# Patient Record
Sex: Female | Born: 1955 | Race: White | Hispanic: No | Marital: Married | Smoking: Never smoker
Health system: Southern US, Community
[De-identification: ages and names within clinical notes are randomized; demographics above are authoritative.]

## PROBLEM LIST (undated history)

## (undated) DIAGNOSIS — N6019 Diffuse cystic mastopathy of unspecified breast: Secondary | ICD-10-CM

## (undated) DIAGNOSIS — N3010 Interstitial cystitis (chronic) without hematuria: Secondary | ICD-10-CM

## (undated) DIAGNOSIS — N390 Urinary tract infection, site not specified: Secondary | ICD-10-CM

## (undated) DIAGNOSIS — R12 Heartburn: Secondary | ICD-10-CM

## (undated) DIAGNOSIS — I890 Lymphedema, not elsewhere classified: Secondary | ICD-10-CM

## (undated) DIAGNOSIS — D649 Anemia, unspecified: Secondary | ICD-10-CM

## (undated) DIAGNOSIS — K5790 Diverticulosis of intestine, part unspecified, without perforation or abscess without bleeding: Secondary | ICD-10-CM

## (undated) DIAGNOSIS — I10 Essential (primary) hypertension: Secondary | ICD-10-CM

## (undated) DIAGNOSIS — E559 Vitamin D deficiency, unspecified: Secondary | ICD-10-CM

## (undated) DIAGNOSIS — E785 Hyperlipidemia, unspecified: Secondary | ICD-10-CM

## (undated) DIAGNOSIS — K5792 Diverticulitis of intestine, part unspecified, without perforation or abscess without bleeding: Secondary | ICD-10-CM

## (undated) DIAGNOSIS — M1990 Unspecified osteoarthritis, unspecified site: Secondary | ICD-10-CM

## (undated) DIAGNOSIS — R011 Cardiac murmur, unspecified: Secondary | ICD-10-CM

## (undated) DIAGNOSIS — E7800 Pure hypercholesterolemia, unspecified: Secondary | ICD-10-CM

## (undated) DIAGNOSIS — D219 Benign neoplasm of connective and other soft tissue, unspecified: Secondary | ICD-10-CM

## (undated) DIAGNOSIS — K219 Gastro-esophageal reflux disease without esophagitis: Secondary | ICD-10-CM

## (undated) HISTORY — DX: Benign neoplasm of connective and other soft tissue, unspecified: D21.9

## (undated) HISTORY — DX: Heartburn: R12

## (undated) HISTORY — DX: Cardiac murmur, unspecified: R01.1

## (undated) HISTORY — DX: Interstitial cystitis (chronic) without hematuria: N30.10

## (undated) HISTORY — DX: Diverticulitis of intestine, part unspecified, without perforation or abscess without bleeding: K57.92

## (undated) HISTORY — DX: Urinary tract infection, site not specified: N39.0

## (undated) HISTORY — PX: CHOLECYSTECTOMY: SHX55

---

## 1985-09-03 HISTORY — PX: OTHER SURGICAL HISTORY: SHX169

## 1985-09-03 HISTORY — PX: CHOLECYSTECTOMY: SHX55

## 1985-09-03 HISTORY — PX: BREAST EXCISIONAL BIOPSY: SUR124

## 2003-09-04 HISTORY — PX: DILATION AND CURETTAGE OF UTERUS: SHX78

## 2004-08-03 ENCOUNTER — Ambulatory Visit: Payer: Self-pay | Admitting: Internal Medicine

## 2005-10-30 ENCOUNTER — Ambulatory Visit: Payer: Self-pay | Admitting: Unknown Physician Specialty

## 2007-11-12 ENCOUNTER — Ambulatory Visit: Payer: Self-pay | Admitting: Unknown Physician Specialty

## 2008-01-30 ENCOUNTER — Ambulatory Visit: Payer: Self-pay | Admitting: Unknown Physician Specialty

## 2008-02-05 ENCOUNTER — Ambulatory Visit: Payer: Self-pay | Admitting: Unknown Physician Specialty

## 2009-12-14 ENCOUNTER — Ambulatory Visit: Payer: Self-pay | Admitting: Unknown Physician Specialty

## 2011-05-24 ENCOUNTER — Ambulatory Visit: Payer: Self-pay | Admitting: Unknown Physician Specialty

## 2012-10-08 ENCOUNTER — Ambulatory Visit: Payer: Self-pay | Admitting: Internal Medicine

## 2013-08-17 ENCOUNTER — Ambulatory Visit: Payer: Self-pay | Admitting: Gastroenterology

## 2013-08-17 HISTORY — PX: COLONOSCOPY: SHX174

## 2013-08-18 ENCOUNTER — Ambulatory Visit: Payer: Self-pay | Admitting: Gastroenterology

## 2013-08-26 ENCOUNTER — Ambulatory Visit: Payer: Self-pay | Admitting: Gastroenterology

## 2013-12-15 ENCOUNTER — Ambulatory Visit: Payer: Self-pay | Admitting: Internal Medicine

## 2013-12-18 ENCOUNTER — Ambulatory Visit: Payer: Self-pay | Admitting: Internal Medicine

## 2015-05-11 ENCOUNTER — Other Ambulatory Visit: Payer: Self-pay | Admitting: Internal Medicine

## 2015-05-11 DIAGNOSIS — R1084 Generalized abdominal pain: Secondary | ICD-10-CM

## 2015-05-19 ENCOUNTER — Ambulatory Visit
Admission: RE | Admit: 2015-05-19 | Discharge: 2015-05-19 | Disposition: A | Discharge status: Home / Self Care | Payer: 59 | Source: Ambulatory Visit | Attending: Internal Medicine | Admitting: Internal Medicine

## 2015-05-19 DIAGNOSIS — K760 Fatty (change of) liver, not elsewhere classified: Secondary | ICD-10-CM | POA: Diagnosis not present

## 2015-05-19 DIAGNOSIS — R1084 Generalized abdominal pain: Secondary | ICD-10-CM | POA: Insufficient documentation

## 2015-05-19 MED ORDER — GADOBENATE DIMEGLUMINE 529 MG/ML IV SOLN
20.0000 mL | Freq: Once | INTRAVENOUS | Status: AC | PRN
  Administered 2015-05-19: 20 mL via INTRAVENOUS

## 2015-08-13 ENCOUNTER — Ambulatory Visit
Admission: EM | Admit: 2015-08-13 | Discharge: 2015-08-13 | Disposition: A | Discharge status: Home / Self Care | Payer: 59 | Attending: Family Medicine | Admitting: Family Medicine

## 2015-08-13 ENCOUNTER — Encounter: Payer: Self-pay | Admitting: Gynecology

## 2015-08-13 DIAGNOSIS — E54 Ascorbic acid deficiency: Secondary | ICD-10-CM | POA: Diagnosis not present

## 2015-08-13 DIAGNOSIS — T452X1A Poisoning by vitamins, accidental (unintentional), initial encounter: Secondary | ICD-10-CM

## 2015-08-13 HISTORY — DX: Unspecified osteoarthritis, unspecified site: M19.90

## 2015-08-13 NOTE — Discharge Instructions (Signed)

## 2015-08-13 NOTE — ED Notes (Signed)
Patient c/o diarrhea on 07/22/2015. Per patient took high dose of vitamin 2mg  x TID.Alexandra Rios Pt. Stated seen on the web that vitamin C help with diarrhea. After taking high dosage of vitamins C pt. Stated that she start having GI issue and again from the web will cause GI issue. Pt stated on the web to take Asorbic Acid. Per patient this am had nose bleed and leg weakness. Pt believe that she might have Scurvy.

## 2015-08-13 NOTE — ED Provider Notes (Signed)
CSN: [id number]     Arrival date & time 08/13/15  1353 History   First MD Initiated Contact with Patient 08/13/15 1641   Nurses notes were reviewed. Chief Complaint  Patient presents with  . GI Problem   Patient is here for concern over side effects and have occurred from taking too much of a non-FDA regulated product over-the-counter. Patient is on no special diet which she is introducing small amounts of certain foods to her diet. 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 To improve the arthritis which she she was told would clear once the bile was clear and she decided to take increased doses of vitamin C and other agents to try to boost her immune system. The way she took that was by taking lysine high doses and vitamin C. The vitamin C she started over-the-counter vitamin C product that basically had the pro-vitamin Guerry Bruin or gastric acid. She states that she was comfortable using it since it said that it was safe however explained to her that there is no activity regulation the supplements and I do not recognize his Erlene Quan is a brand this certainly is going to do everything FDA level of manufacturing. Over the last few weeks she isincreased diarrhea GI problems and she finally came to the conclusion that the supplement was not doing well for her by her. She thought she weaned herself off the supplements satisfactory slowly but last dose was Thursday and she started having the last 2-3 days weakness of her legs as well as sheath was concerned about scurvy diarrhea and today while at a rehearsal she started having nosebleed. She has checked online about reduction of vitamin C doses and has seen where people can have a withdrawal state from vitamin C which is similar to a vitamin C mega-deficiency. Because of that she has come in to be seen and evaluated. (Consider location/radiation/quality/duration/timing/severity/associated sxs/prior Treatment) Patient is a 59 y.o. female  presenting with GI illness. The history is provided by the patient.  GI Problem This is a new problem. The current episode started more than 2 days ago. The problem has not changed since onset.Pertinent negatives include no chest pain, no abdominal pain, no headaches and no shortness of breath. Nothing aggravates the symptoms. Nothing relieves the symptoms. She has tried nothing for the symptoms. The treatment provided no relief.    Past Medical History  Diagnosis Date  . Arthritis     viral   Past Surgical History  Procedure Laterality Date  . Cholecystectomy     History reviewed. No pertinent family history. Social History  Substance Use Topics  . Smoking status: Never Smoker   . Smokeless tobacco: None  . Alcohol Use: No   OB History    No data available     Review of Systems  Constitutional: Positive for activity change.  HENT: Positive for nosebleeds.   Respiratory: Negative for shortness of breath and stridor.   Cardiovascular: Negative for chest pain.  Gastrointestinal: Negative for abdominal pain.  Musculoskeletal: Positive for myalgias, joint swelling and arthralgias.  Skin: Negative for color change, pallor and rash.  Neurological: Negative for headaches.  Psychiatric/Behavioral: Negative for hallucinations.  All other systems reviewed and are negative.   Allergies  Cephalosporins; Dairy aid; and Eggs or egg-derived products  Home Medications   Prior to Admission medications   Medication Sig Start Date End Date Taking? Authorizing Provider  Ascorbic Acid POWD by Does not apply route.   Yes  Historical Provider, MD  DIGEST ENZYMES-ANTICHOLINERGIC PO Take by mouth.   Yes Historical Provider, MD  Fish Oil-Krill Oil (KRILL OIL PLUS) CAPS Take by mouth.   Yes Historical Provider, MD  Lactobacillus ([place] PRO-B PO) Take by mouth.   Yes Historical Provider, MD   Meds Ordered and Administered this Visit  Medications - No data to display  BP 124/70 mmHg  Pulse  62  Temp(Src) 97.7 F (36.5 C)  Resp 16  Ht 5\' 5"  (1.651 m)  Wt 215 lb (97.523 kg)  BMI 35.78 kg/m2  SpO2 100% No data found.   Physical Exam  Constitutional: She is oriented to person, place, and time. She appears well-developed and well-nourished.  HENT:  Head: Normocephalic and atraumatic.  Eyes: Conjunctivae are normal. Pupils are equal, round, and reactive to light.  Musculoskeletal: Normal range of motion.  Neurological: She is alert and oriented to person, place, and time.  Skin: Skin is warm.  Psychiatric: She has a normal mood and affect.  Vitals reviewed.   ED Course  Procedures (including critical care time)  Labs Review Labs Reviewed - No data to display  Imaging Review No results found.   Visual Acuity Review  Right Eye Distance:   Left Eye Distance:   Bilateral Distance:    Right Eye Near:   Left Eye Near:    Bilateral Near:         MDM   1. Vitamin C overdose, accidental or unintentional, initial encounter   2. Vitamin C deficiency     We have spent this visit mostly in counseling. Explained to her that is difficult taking a non-FDA regulated product and extrapolating how much vitamin C she did take with this product, she is not getting. I have suggested that she the future use vitamins with high standards and not just over the shelf and that the time being since she cannot eat citrus fruits that she consider consuming sutures juices to build her fibrin sea level up to a high level and then slowly taper it under more control conditions. I recommend 12 ounces of vitamin C in the form of orange juice 4 times a day for about 4 days and then reducing it by about 4 ounces over the next several days. Hopefully this will give her the level of vitamin C that she needs and then slowly taper off of it. I suggested the future to be careful about the supplementation she's taking.   As far as the diarrhea we talked about the need to check clostridium or overt  parasites and C&S but she really has not been on any antibiotics lately and does not feel like she has an infection so we will would offer any stool specimen for stool analysis.    Frederich Cha, MD 08/14/15 [phone]

## 2015-08-30 ENCOUNTER — Ambulatory Visit (INDEPENDENT_AMBULATORY_CARE_PROVIDER_SITE_OTHER): Payer: 59 | Admitting: Podiatry

## 2015-08-30 ENCOUNTER — Ambulatory Visit (INDEPENDENT_AMBULATORY_CARE_PROVIDER_SITE_OTHER): Payer: 59

## 2015-08-30 ENCOUNTER — Encounter: Payer: Self-pay | Admitting: Podiatry

## 2015-08-30 VITALS — BP 121/60 | HR 62 | Resp 16

## 2015-08-30 DIAGNOSIS — G43909 Migraine, unspecified, not intractable, without status migrainosus: Secondary | ICD-10-CM | POA: Insufficient documentation

## 2015-08-30 DIAGNOSIS — E669 Obesity, unspecified: Secondary | ICD-10-CM | POA: Insufficient documentation

## 2015-08-30 DIAGNOSIS — I10 Essential (primary) hypertension: Secondary | ICD-10-CM | POA: Insufficient documentation

## 2015-08-30 DIAGNOSIS — M2040 Other hammer toe(s) (acquired), unspecified foot: Secondary | ICD-10-CM

## 2015-08-30 DIAGNOSIS — D649 Anemia, unspecified: Secondary | ICD-10-CM | POA: Insufficient documentation

## 2015-08-30 DIAGNOSIS — N390 Urinary tract infection, site not specified: Secondary | ICD-10-CM | POA: Insufficient documentation

## 2015-08-30 NOTE — Progress Notes (Signed)
   Subjective:    Patient ID: Alexandra Rios, female    DOB: 04-May-1956, 59 y.o.   MRN: [id number]  HPI: She presents today with a chief complaint of pain to the second third toes bilateral left greater than right she also has pain to the second metatarsophalangeal joints bilateral. 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 She states that the arthritis in her hands have been resolving and so has arthritis in her feet to some degree. She states the deformity in her toes developed since October. She states that she has also developed tenderness beneath the second and third metatarsophalangeal joints. She has a history of a wart that she has been treating with Compound W plantar left foot.      Review of Systems  Musculoskeletal: Positive for arthralgias.  All other systems reviewed and are negative.      Objective:   Physical Exam: 59 year old female vital signs stable alert and oriented 3 pulses are strongly palpable. Neurologic sensorium is intact. Deep tendon reflexes are intact. Muscle strength was 5 over 5 dorsiflexion and plantar flexors and inverters everters all of his musculature is intact. Orthopedic evaluation demonstrates mild hammertoe deformities #2 #3 bilaterally. Left greater than right she has rigid deformity second PIPJ left foot that will not straighten out to complete horizontal. She has an area of erythema overlying the PIPJ significant for irritation with shoe gear. She also has tenderness on range of motion of the second metatarsophalangeal joint on dorsiflexion and plantar flexion with end range of motion. Radiographs confirm hammertoe deformities bilateral no major osseous abnormalities on radiograph. Cutaneous evaluation demonstrates no erythema edema cellulitis drainage or odor the exception of mild erythema overlying the PIPJ second digit left foot mild reactive hyperkeratosis to the plantar aspect of the second metatarsophalangeal joint  left foot and an area of reactive hyperkeratosis plantar aspect of the left foot which does appear to be an old wart. I debrided this lesion today and it appears to be perfectly normal beneath the skin.        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 Mild capsulitis of the second metatarsophalangeal joint left foot greater than that of the right. Well-healed wart plantar aspect left foot.  Plan: Discussed appropriate shoe gear stretching exercises ice therapy shoe gear modifications we discussed stiff soled shoes at this point. I offered her an injection which she declined all of her medication which she declined. I will follow-up with her as needed.

## 2015-11-02 HISTORY — PX: BREAST BIOPSY: SHX20

## 2015-11-17 ENCOUNTER — Encounter: Payer: Self-pay | Admitting: *Deleted

## 2015-11-22 ENCOUNTER — Ambulatory Visit: Payer: Self-pay | Admitting: Urology

## 2015-11-22 ENCOUNTER — Encounter: Payer: Self-pay | Admitting: Urology

## 2015-11-25 ENCOUNTER — Ambulatory Visit: Payer: Self-pay | Admitting: Urology

## 2015-11-28 ENCOUNTER — Encounter: Payer: Self-pay | Admitting: General Surgery

## 2015-11-28 ENCOUNTER — Ambulatory Visit (INDEPENDENT_AMBULATORY_CARE_PROVIDER_SITE_OTHER): Payer: 59 | Admitting: General Surgery

## 2015-11-28 ENCOUNTER — Other Ambulatory Visit: Payer: Self-pay

## 2015-11-28 VITALS — BP 128/74 | HR 80 | Resp 14 | Ht 65.0 in | Wt 244.0 lb

## 2015-11-28 DIAGNOSIS — N6320 Unspecified lump in the left breast, unspecified quadrant: Secondary | ICD-10-CM

## 2015-11-28 DIAGNOSIS — N63 Unspecified lump in breast: Secondary | ICD-10-CM | POA: Diagnosis not present

## 2015-11-28 NOTE — Progress Notes (Signed)
Patient ID: Alexandra Rios, female   DOB: [birth year], 58 y.o.   MRN: [id number]  Chief Complaint  Patient presents with  . other    left breast mass    HPI Alexandra Rios is a 60 y.o. female here today for a evaluation of a left breast mass. Last mammogram was 08/2015  Noticed lump 2 weeks ago. A little tender to touch. She states it is the same area that a biopsy was completed 25 years ago shortly after the birth of her only child with the finding of fibrocystic changes.  The patient reports that she was reading a book and appreciated some discomfort in the superior lateral aspect of the breast. She looked in the mirror and saw a domed fullness and at that time became aware of a palpable mass. No description of erythema or lymphangitic streaking.  No history of trauma to the area.  Patient does not perform self breast check but gets regular mammograms.  I personally reviewed the patient's history. HPI  Past Medical History  Diagnosis Date  . Arthritis     viral    Past Surgical History  Procedure Laterality Date  . Cholecystectomy    . Breast biopsy  Left 1987  . Dilation and curettage of uterus  2005    History reviewed. No pertinent family history.  Social History Social History  Substance Use Topics  . Smoking status: Never Smoker   . Smokeless tobacco: None  . Alcohol Use: No    Allergies  Allergen Reactions  . Cephalosporins Itching  . Dairy Aid [Lactase]   . Eggs Or Egg-Derived Products   . Sulfamethoxazole-Trimethoprim Itching    Current Outpatient Prescriptions  Medication Sig Dispense Refill  . Ascorbic Acid POWD by Does not apply route.    . D-Mannose POWD Take by mouth.    . DIGEST ENZYMES-ANTICHOLINERGIC PO Take by mouth.    . Fish Oil-Krill Oil (KRILL OIL PLUS) CAPS Take by mouth.    . Lactobacillus ([place] PRO-B PO) Take by mouth.     No current facility-administered medications for this visit.    Review of Systems Review of Systems   Constitutional: Negative.   Respiratory: Negative.   Cardiovascular: Negative.     Blood pressure 128/74, pulse 80, resp. rate 14, height 5\' 5"  (1.651 m), weight 244 lb (110.678 kg).  Physical Exam Physical Exam  Constitutional: She is oriented to person, place, and time. She appears well-developed and well-nourished.  Eyes: Conjunctivae are normal. No scleral icterus.  Neck: Neck supple.  Cardiovascular: Normal rate, regular rhythm and normal heart sounds.   Pulmonary/Chest: Effort normal and breath sounds normal. Right breast exhibits no inverted nipple, no mass, no nipple discharge, no skin change and no tenderness. Left breast exhibits tenderness (primarily in the upper-outer quadrant, less so in the other areas of the breast). Left breast exhibits no inverted nipple, no nipple discharge and no skin change.    Abdominal: Soft. Normal appearance and bowel sounds are normal. There is no tenderness.  Lymphadenopathy:    She has no cervical adenopathy.    She has no axillary adenopathy.  Neurological: She is alert and oriented to person, place, and time.  Skin: Skin is warm and dry.    Data Reviewed GYN notes of 11/17/2015 completed by Kelton Pillar, M.D. were reviewed. December 2016 mammograms from [facility] reviewed. (Report pending). Primarily fatty replaced breasts with no discernible mass in the upper-outer quadrant on the left.  Ultrasound examination of  the area of palpable thickening showed an ill-defined slightly hypoechoic mass measuring 1 x 1.48 x 1.49 cm in the 1:00 position when the patient was examined in a semierect position with her arm elevated over her head. The area is more clearly palpable and evident on ultrasound. [id number].  Assessment    New left breast mass, normal mammograms December 2016, indeterminant ultrasound today.    Plan    The area requires biopsy to tablets of pathologic diagnosis. The patient was very interested in all the possible  benign diagnoses, I explained to her I was really only interested if it was malignant and a biopsy would be strongly recommended. It is certainly not a cyst based on ultrasound.  The patient is amenable to return tomorrow for a biopsy area as was after 5:00 PM when her visit was being completed. Next this will not change when the biopsy results are available.    Patient to return tomorrow for left breast biopsy  PCP:  Fulton Reek D This information has been scribed by Gaspar Cola CMA.    Robert Bellow 11/28/2015, 9:37 PM

## 2015-11-28 NOTE — Patient Instructions (Signed)
Patient to return for a left breast biopsy.

## 2015-11-29 ENCOUNTER — Encounter: Payer: Self-pay | Admitting: General Surgery

## 2015-11-29 ENCOUNTER — Ambulatory Visit: Payer: Self-pay

## 2015-11-29 ENCOUNTER — Ambulatory Visit (INDEPENDENT_AMBULATORY_CARE_PROVIDER_SITE_OTHER): Payer: 59 | Admitting: Urology

## 2015-11-29 ENCOUNTER — Encounter: Payer: Self-pay | Admitting: Urology

## 2015-11-29 ENCOUNTER — Ambulatory Visit (INDEPENDENT_AMBULATORY_CARE_PROVIDER_SITE_OTHER): Payer: 59 | Admitting: General Surgery

## 2015-11-29 ENCOUNTER — Ambulatory Visit
Admission: RE | Admit: 2015-11-29 | Discharge: 2015-11-29 | Disposition: A | Discharge status: Home / Self Care | Payer: 59 | Source: Ambulatory Visit | Attending: Urology | Admitting: Urology

## 2015-11-29 VITALS — BP 141/74 | HR 72 | Ht 65.5 in | Wt 241.1 lb

## 2015-11-29 VITALS — BP 130/78 | HR 76 | Resp 12 | Ht 65.0 in | Wt 239.0 lb

## 2015-11-29 DIAGNOSIS — R3129 Other microscopic hematuria: Secondary | ICD-10-CM | POA: Diagnosis not present

## 2015-11-29 DIAGNOSIS — R109 Unspecified abdominal pain: Secondary | ICD-10-CM

## 2015-11-29 DIAGNOSIS — N63 Unspecified lump in breast: Secondary | ICD-10-CM | POA: Diagnosis not present

## 2015-11-29 DIAGNOSIS — IMO0002 Reserved for concepts with insufficient information to code with codable children: Secondary | ICD-10-CM

## 2015-11-29 DIAGNOSIS — N6320 Unspecified lump in the left breast, unspecified quadrant: Secondary | ICD-10-CM

## 2015-11-29 DIAGNOSIS — N390 Urinary tract infection, site not specified: Secondary | ICD-10-CM

## 2015-11-29 DIAGNOSIS — R748 Abnormal levels of other serum enzymes: Secondary | ICD-10-CM | POA: Diagnosis not present

## 2015-11-29 DIAGNOSIS — IMO0001 Reserved for inherently not codable concepts without codable children: Secondary | ICD-10-CM

## 2015-11-29 DIAGNOSIS — N8110 Cystocele, unspecified: Secondary | ICD-10-CM | POA: Diagnosis not present

## 2015-11-29 DIAGNOSIS — R7989 Other specified abnormal findings of blood chemistry: Secondary | ICD-10-CM

## 2015-11-29 LAB — URINALYSIS, COMPLETE
Bilirubin, UA: NEGATIVE
Glucose, UA: NEGATIVE
Ketones, UA: NEGATIVE
LEUKOCYTES UA: NEGATIVE
NITRITE UA: NEGATIVE
Specific Gravity, UA: 1.025 (ref 1.005–1.030)
Urobilinogen, Ur: 0.2 mg/dL (ref 0.2–1.0)
pH, UA: 7 (ref 5.0–7.5)

## 2015-11-29 LAB — MICROSCOPIC EXAMINATION

## 2015-11-29 LAB — BLADDER SCAN AMB NON-IMAGING: Scan Result: 0

## 2015-11-29 NOTE — Progress Notes (Signed)
11/29/2015 7:27 PM   Eugenio Hoes 03/30/1956 [id number]  Referring provider: Idelle Crouch, MD [facility] [place], [place] [address]  Chief Complaint  Patient presents with  . Recurrent UTI    referred by Dr. Vernie Ammons  . Hematuria    HPI: Patient is a 60 year old Caucasian female who is referred to [place] by Dr. Vernie Ammons for recurrent urinary tract infections and hematuria for further evaluation and management.  Over the past year, patient has had 4 urinary tract infections.  They have been positive for Escherichia coli and Klebsiella and have been pansensitive.  She complains of urine, chills and feeling of general malaise with these infections.  She has not experienced any gross hematuria, dysuria or suprapubic pain with the infections.  She has not had fevers or vomiting with the infections.  As been having intermittent right-sided abdominal pain.    She is using OTC urine strips to check for infections.  When she feels that she may have a UTI coming on, she uses these strips to see if she may have an infection.  If the strip was positive for leukocytes or blood, she starts and OTC supplement, D-Mannose.  This supplement will sometimes cause her symptoms to abate.  She has concern that these strips often positive for leukocytes or blood and would like further evaluation concerning these findings.  She is sexually active, but she has not noted a correlation with these infections and sexual activity. Her UA today is unremarkable and her PVR is 0 mL.    Of note, she had a breast biopsy this morning.     PMH: Past Medical History  Diagnosis Date  . Arthritis     viral  . Heart burn   . Recurrent UTI   . Heart murmur     Surgical History: Past Surgical History  Procedure Laterality Date  . Cholecystectomy    . Breast biopsy  Left 1987  . Dilation and curettage of uterus  2005    Home Medications:    Medication List       This list  is accurate as of: 11/29/15 11:59 PM.  Always use your most recent med list.               Ascorbic Acid Powd  by Does not apply route. Reported on 11/29/2015     CENTRUM SILVER PO  Take by mouth.     D-Mannose Powd  Take by mouth.     DIGEST ENZYMES-ANTICHOLINERGIC PO  Take by mouth.     KRILL OIL PLUS Caps  Take by mouth.     [place] PRO-B PO  Take by mouth.        Allergies:  Allergies  Allergen Reactions  . Cephalosporins Itching  . Dairy Aid [Lactase]   . Eggs Or Egg-Derived Products   . Sulfamethoxazole-Trimethoprim Itching    Family History: Family History  Problem Relation Age of Onset  . Kidney disease Neg Hx   . Bladder Cancer Neg Hx     Social History:  reports that she has never smoked. She does not have any smokeless tobacco history on file. She reports that she does not drink alcohol or use illicit drugs.  ROS: UROLOGY Frequent Urination?: No Hard to postpone urination?: No Burning/pain with urination?: No Get up at night to urinate?: Yes Leakage of urine?: Yes Urine stream starts and stops?: No Trouble starting stream?: No Do you have to strain to urinate?: No  Blood in urine?: Yes Urinary tract infection?: Yes Sexually transmitted disease?: No Injury to kidneys or bladder?: No Painful intercourse?: No Weak stream?: No Currently pregnant?: No Vaginal bleeding?: No Last menstrual period?: n  Gastrointestinal Nausea?: No Vomiting?: No Indigestion/heartburn?: No Diarrhea?: No Constipation?: No  Constitutional Fever: No Night sweats?: Yes Weight loss?: No Fatigue?: Yes  Skin Skin rash/lesions?: No Itching?: No  Eyes Blurred vision?: No Double vision?: No  Ears/Nose/Throat Sore throat?: No Sinus problems?: Yes  Hematologic/Lymphatic Swollen glands?: Yes Easy bruising?: No  Cardiovascular Leg swelling?: No Chest pain?: No  Respiratory Cough?: No Shortness of breath?: No  Endocrine Excessive thirst?:  No  Musculoskeletal Back pain?: Yes Joint pain?: Yes  Neurological Headaches?: No Dizziness?: No  Psychologic Depression?: No Anxiety?: No  Physical Exam: BP 141/74 mmHg  Pulse 72  Ht 5' 5.5" (1.664 m)  Wt 241 lb 1.6 oz (109.362 kg)  BMI 39.50 kg/m2  Constitutional: Well nourished. Alert and oriented, No acute distress. HEENT: [place] AT, moist mucus membranes. Trachea midline, no masses. Cardiovascular: No clubbing, cyanosis, or edema. Respiratory: Normal respiratory effort, no increased work of breathing. GI: Abdomen is soft, non tender, non distended, no abdominal masses. Liver and spleen not palpable.  No hernias appreciated.  Stool sample for occult testing is not indicated.   GU: No CVA tenderness.  No bladder fullness or masses.  Normal external genitalia, normal pubic hair distribution, no lesions.  Normal urethral meatus, no lesions, no prolapse, no discharge.   No urethral masses, tenderness and/or tenderness. No bladder fullness, tenderness or masses. Normal vagina mucosa, good estrogen effect, no discharge, no lesions, good pelvic support, no cystocele or rectocele noted.  No cervical motion tenderness.  Uterus is freely mobile and non-fixed.  No adnexal/parametria masses or tenderness noted.  Anus and perineum are without rashes or lesions.    Skin: No rashes, bruises or suspicious lesions. Lymph: No cervical or inguinal adenopathy. Neurologic: Grossly intact, no focal deficits, moving all 4 extremities. Psychiatric: Normal mood and affect.  Laboratory Data: Urinalysis Microscopic Examination  Result Value Ref Range   WBC, UA 0-5 0 -  5 /hpf   RBC, UA 0-2 0 -  2 /hpf   Epithelial Cells (non renal) 0-10 0 - 10 /hpf   Mucus, UA Present (A) Not Estab.   Bacteria, UA Few (A) None seen/Few  Urinalysis, Complete  Result Value Ref Range   Specific Gravity, UA 1.025 1.005 - 1.030   pH, UA 7.0 5.0 - 7.5   Color, UA Yellow Yellow   Appearance Ur Clear Clear   Leukocytes,  UA Negative Negative   Protein, UA 1+ (A) Negative/Trace   Glucose, UA Negative Negative   Ketones, UA Negative Negative   RBC, UA Trace (A) Negative   Bilirubin, UA Negative Negative   Urobilinogen, Ur 0.2 0.2 - 1.0 mg/dL   Nitrite, UA Negative Negative   Microscopic Examination See below:     + UCx for E. Coli 01/07/2015 + UCx for Kleb 06/08/2015 + UCx for E. Coli 09/27/2015 + UCx for E. Coli for 10/21/2015  Pertinent Imaging: Results for AKEISHA, FRANCES (MRN [id number]) as of 11/29/2015 16:06  Ref. Range 11/29/2015 15:52  Scan Result Unknown 0    Assessment & Plan:    1. Recurrent UTI:   Patient has had 4 documented urinary tract infections over the last year.  Her UA is unremarkable at this time.  reviewed perineal hygiene with the patient.  Her UA was unremarkable at this  time.  Her PVR is 0 mL so retention of urine is not a cause of her UTI's.  She was not found to have vaginal atrophy at this time.  We will send the urine for culture.  We will continue to monitor.  I have advised the patient not to use the OTC urine strips, as they can often be inaccurate.  We will obtain a KUB at this time to look for stones.    - Urinalysis, Complete - CULTURE, URINE COMPREHENSIVE - BUN+Creat  2. Positive urine dips for blood:   I explained to the patient's that AUA guidelines require 3 or more red blood cells per high-power field on a microscopic examination of urine or gross hematuria to pursue a hematuria workup.  At this time, she does not meet the criteria as she has not had gross hematuria or documented microscopic hematuria.  We will continue to monitor microscopic UA's.  She will report any episodes of gross hematuria.  3. Right sided abdominal pain:   We will obtain a KUB today to look for a possible stone that may be causing her right-sided abdominal pain and recurrent urinary tract infections.  Return for pending urine culture and KUB resutls.  These notes generated with voice  recognition software. I apologize for typographical errors.  Zara Council, PA-C  [facility] [address], [place], [place] [address] [phone]  Addendum:  Patient was found to have a serum creatinine of 1.26 mg/dL with a GFR of 47 on 11/29/2015. This is a significant decrease of kidney function from a serum creatinine of 0.7 and a GFR of 86 in 05/11/2015.  She was also found to have an incident of microscopic hematuria with her at her PCP's office with 4-10RBC's/hpf on 05/11/2015.  The decision was made to obtain a CT Urogram.  Results are below:  CLINICAL DATA: Intermittent urinary tract infections since September 2016. Recent hematuria.  EXAM: CT ABDOMEN AND PELVIS WITHOUT AND WITH CONTRAST  TECHNIQUE: Multidetector CT imaging of the abdomen and pelvis was performed following the standard protocol before and following the bolus administration of intravenous contrast.  CONTRAST: 135mL OMNIPAQUE IOHEXOL 350 MG/ML SOLN  COMPARISON: None.  FINDINGS: Lower chest: The lung bases are clear of acute process. No pleural effusion or pulmonary lesions. The heart is normal in size. No pericardial effusion. The distal esophagus and aorta are unremarkable.  Hepatobiliary: No focal hepatic lesions or intrahepatic biliary dilatation. The gallbladder is surgically absent. No common bile duct dilatation.  Pancreas: No mass, inflammation or ductal dilatation.  Spleen: Normal size. No focal lesions.  Adrenals/Urinary Tract: The adrenal glands are normal.  No renal, ureteral or bladder calculi. Both kidneys demonstrate normal enhancement/perfusion following contrast administration. No renal lesions. The delayed images do not demonstrate any collecting system abnormalities. Both ureters are normal. The bladder is normal.  Stomach/Bowel: The stomach, duodenum, small bowel and colon are grossly normal without oral contrast. No  obvious inflammatory changes, mass lesions or obstructive findings. Moderate stool noted throughout the colon. Moderate colonic diverticulosis without findings for acute diverticulitis. The terminal ileum is normal. The appendix is normal.  Vascular/Lymphatic: The aorta and branch vessels are patent. No significant atherosclerotic changes. The major venous structures are patent. Hazy interstitial change in the root of the small bowel mesenteric a along with small scattered lymph nodes consistent with mesenteritis or panniculitis. This is a benign incidental finding.  No mesenteric or retroperitoneal mass or adenopathy. Small scattered lymph nodes are noted.  Reproductive: PE uterus and ovaries are unremarkable.  Other: No pelvic mass or adenopathy. No free pelvic fluid collections. No inguinal mass or adenopathy. No abdominal wall hernia or subcutaneous lesions.  Musculoskeletal: No significant bony findings. Moderate thoracolumbar scoliosis.  IMPRESSION: 1. No CT findings to account for the patient's hematuria. No renal, ureteral or bladder calculi or mass. 2. No acute abdominal/pelvic findings, mass lesions or lymphadenopathy. 3. Status post cholecystectomy. No biliary dilatation.   Electronically Signed  By: Marijo Sanes M.D.  On: 12/02/2015 11:33   Patient was contacted with these results.  She will be undergoing a cystoscopy in the future to complete her hematuria work-up.  She will also be referred to nephrology for further evaluation of the decline in her kidney function.

## 2015-11-29 NOTE — Progress Notes (Signed)
Patient ID: Alexandra Rios, female   DOB: 07/04/1956, 60 y.o.   MRN: [id number]  Chief Complaint  Patient presents with  . Procedure    left breast biopsy    HPI [name] is a 60 y.o. female here today for a left breast biopsy.No change in her clinical history since yesterday's exam. HPI  Past Medical History  Diagnosis Date  . Arthritis     viral  . Heart burn   . Recurrent UTI   . Heart murmur     Past Surgical History  Procedure Laterality Date  . Cholecystectomy    . Breast biopsy  Left 1987  . Dilation and curettage of uterus  2005    Family History  Problem Relation Age of Onset  . Kidney disease Neg Hx   . Bladder Cancer Neg Hx     Social History Social History  Substance Use Topics  . Smoking status: Never Smoker   . Smokeless tobacco: None  . Alcohol Use: No    Allergies  Allergen Reactions  . Cephalosporins Itching  . Dairy Aid [Lactase]   . Eggs Or Egg-Derived Products   . Sulfamethoxazole-Trimethoprim Itching    Current Outpatient Prescriptions  Medication Sig Dispense Refill  . Ascorbic Acid POWD by Does not apply route. Reported on 11/29/2015    . D-Mannose POWD Take by mouth.    . DIGEST ENZYMES-ANTICHOLINERGIC PO Take by mouth.    . Fish Oil-Krill Oil (KRILL OIL PLUS) CAPS Take by mouth.    . Lactobacillus ([place] PRO-B PO) Take by mouth.    . Multiple Vitamins-Minerals (CENTRUM SILVER PO) Take by mouth.     No current facility-administered medications for this visit.    Review of Systems Review of Systems  Blood pressure 130/78, pulse 76, resp. rate 12, height 5\' 5"  (1.651 m), weight 239 lb (108.41 kg).  Physical Exam Physical Exam  Pulmonary/Chest:      Data Reviewed Mammogram report from 08/09/2015 was reviewed. No interval change from multiple past exams. BI-RADS-1.  Assessment    Left breast mass.    Plan    The procedure was reviewed with the patient and her husband. Ultrasound again showed a very  ill-defined area in the upper-outer quadrant of the left breast. 10 mL of 0.5% Xylocaine with 0.25% Marcaine with 1-200,000 units of epinephrine was utilized well tolerated. ChloraPrep was applied to the skin. A 10-gauge Encor device was advanced under ultrasound guidance and 6 core samples obtained. Then making use of palpation of the palpable mass on additional 6 core samples were obtained. A postbiopsy clip was placed. No bleeding was noted. Skin defect was closed with benzoin and Steri-Strips followed by Telfa and Tegaderm dressing.  The patient will be contacted when pathology is available. Suturing the unusual presentation with a visible mass and a markedly abnormal exam with soft ultrasound findings (as well as a normal mammogram 3.5 months ago) if the biopsy is negative formal open excision may be warranted.      PCP:  Doy Hutching This information has been scribed by Gaspar Cola CMA.    Robert Bellow 11/29/2015, 3:47 PM

## 2015-11-29 NOTE — Patient Instructions (Signed)

## 2015-11-30 ENCOUNTER — Telehealth: Payer: Self-pay | Admitting: General Surgery

## 2015-11-30 LAB — BUN+CREAT
BUN/Creatinine Ratio: 14 (ref 9–23)
BUN: 18 mg/dL (ref 6–24)
CREATININE: 1.26 mg/dL — AB (ref 0.57–1.00)
GFR calc Af Amer: 54 mL/min/{1.73_m2} — ABNORMAL LOW (ref >59)
GFR, EST NON AFRICAN AMERICAN: 47 mL/min/{1.73_m2} — AB (ref >59)

## 2015-11-30 NOTE — Telephone Encounter (Signed)
Biopsy results have been received: Fibrocystic changes with adenosis and calcification. 17 cores reviewed.  Patient reports she's had some bright red blood under the dressing which has saturated gauze. She has been asked to remove this dressing applied a large Band-Aid. She is welcome to come in for dressing change if desired.  Considering the large volume of tissue removed and the minimal on ultrasound, I think is reasonable to put further tissue sampling on hold.  Arrangements were made for the patient to return in one month repeat examination.

## 2015-12-01 ENCOUNTER — Telehealth: Payer: Self-pay | Admitting: Urology

## 2015-12-01 ENCOUNTER — Telehealth: Payer: Self-pay

## 2015-12-01 LAB — CULTURE, URINE COMPREHENSIVE

## 2015-12-01 NOTE — Telephone Encounter (Signed)
-----   Message from Nori Riis, PA-C sent at 12/01/2015  2:20 PM EDT ----- Patient's urine culture is negative.

## 2015-12-01 NOTE — Telephone Encounter (Signed)
LMOM- recent labs negative.

## 2015-12-01 NOTE — Telephone Encounter (Signed)
Patient notified of neg urine culture.

## 2015-12-02 ENCOUNTER — Ambulatory Visit
Admission: RE | Admit: 2015-12-02 | Discharge: 2015-12-02 | Disposition: A | Discharge status: Home / Self Care | Payer: 59 | Source: Ambulatory Visit | Attending: Urology | Admitting: Urology

## 2015-12-02 ENCOUNTER — Other Ambulatory Visit: Payer: Self-pay | Admitting: Urology

## 2015-12-02 DIAGNOSIS — R7989 Other specified abnormal findings of blood chemistry: Secondary | ICD-10-CM

## 2015-12-02 DIAGNOSIS — R3129 Other microscopic hematuria: Secondary | ICD-10-CM | POA: Diagnosis present

## 2015-12-02 MED ORDER — IOHEXOL 350 MG/ML SOLN
100.0000 mL | Freq: Once | INTRAVENOUS | Status: AC | PRN
  Administered 2015-12-02: 100 mL via INTRAVENOUS

## 2015-12-03 ENCOUNTER — Telehealth: Payer: Self-pay | Admitting: Urology

## 2015-12-03 DIAGNOSIS — R7989 Other specified abnormal findings of blood chemistry: Secondary | ICD-10-CM | POA: Insufficient documentation

## 2015-12-03 DIAGNOSIS — IMO0001 Reserved for inherently not codable concepts without codable children: Secondary | ICD-10-CM | POA: Insufficient documentation

## 2015-12-03 DIAGNOSIS — N390 Urinary tract infection, site not specified: Secondary | ICD-10-CM | POA: Insufficient documentation

## 2015-12-03 DIAGNOSIS — R3129 Other microscopic hematuria: Secondary | ICD-10-CM | POA: Insufficient documentation

## 2015-12-03 DIAGNOSIS — IMO0002 Reserved for concepts with insufficient information to code with codable children: Secondary | ICD-10-CM

## 2015-12-03 NOTE — Telephone Encounter (Signed)
Would you fax my note from 11/29/2015 to Dr. Laury Axon office?

## 2015-12-05 ENCOUNTER — Encounter: Payer: Self-pay | Admitting: Urology

## 2015-12-05 NOTE — Telephone Encounter (Signed)
Done ° ° °Alexandra Rios °

## 2015-12-06 NOTE — Telephone Encounter (Signed)
Please advise 

## 2015-12-23 ENCOUNTER — Ambulatory Visit (INDEPENDENT_AMBULATORY_CARE_PROVIDER_SITE_OTHER): Payer: 59 | Admitting: Urology

## 2015-12-23 ENCOUNTER — Encounter: Payer: Self-pay | Admitting: Urology

## 2015-12-23 VITALS — BP 144/72 | Ht 65.0 in | Wt 237.2 lb

## 2015-12-23 DIAGNOSIS — R3129 Other microscopic hematuria: Secondary | ICD-10-CM

## 2015-12-23 DIAGNOSIS — N390 Urinary tract infection, site not specified: Secondary | ICD-10-CM | POA: Diagnosis not present

## 2015-12-23 LAB — URINALYSIS, COMPLETE
BILIRUBIN UA: NEGATIVE
Glucose, UA: NEGATIVE
Ketones, UA: NEGATIVE
Leukocytes, UA: NEGATIVE
NITRITE UA: NEGATIVE
PH UA: 5 (ref 5.0–7.5)
Protein, UA: NEGATIVE
Specific Gravity, UA: 1.02 (ref 1.005–1.030)
UUROB: 0.2 mg/dL (ref 0.2–1.0)

## 2015-12-23 LAB — MICROSCOPIC EXAMINATION: BACTERIA UA: NONE SEEN

## 2015-12-23 MED ORDER — LIDOCAINE HCL 2 % EX GEL
1.0000 "application " | Freq: Once | CUTANEOUS | Status: AC
  Administered 2015-12-23: 1 via URETHRAL

## 2015-12-23 MED ORDER — CIPROFLOXACIN HCL 500 MG PO TABS
500.0000 mg | ORAL_TABLET | Freq: Once | ORAL | Status: AC
  Administered 2015-12-23: 500 mg via ORAL

## 2015-12-23 NOTE — Progress Notes (Signed)
12/23/2015 4:17 PM   Eugenio Hoes 05/12/1956 [id number]  Referring provider: Idelle Crouch, MD [facility] [place], [place] [address]  Chief Complaint  Patient presents with  . Cysto    HPI: Patient is a 60 year old Caucasian female who is referred to [place] by Dr. Vernie Ammons for recurrent urinary tract infections and hematuria for further evaluation and management.  Over the past year, patient has had 4 urinary tract infections. They have been positive for Escherichia coli and Klebsiella and have been pansensitive. She complains of urine, chills and feeling of general malaise with these infections. She has not experienced any gross hematuria, dysuria or suprapubic pain with the infections. She has not had fevers or vomiting with the infections. As been having intermittent right-sided abdominal pain.   She is using OTC urine strips to check for infections. When she feels that she may have a UTI coming on, she uses these strips to see if she may have an infection. If the strip was positive for leukocytes or blood, she starts and OTC supplement, D-Mannose. This supplement will sometimes cause her symptoms to abate.  She has concern that these strips often positive for leukocytes or blood and would like further evaluation concerning these findings.  She is sexually active, but she has not noted a correlation with these infections and sexual activity. Her UA today is unremarkable and her PVR is 0 mL.   Interval history: The patient was found to have microscopic hematuria under primary care's office. She underwent a CT urogram which was unremarkable for cause of her microscopic hematuria. She also had a change in her GFR was referred to nephrology.  PMH: Past Medical History  Diagnosis Date  . Arthritis     viral  . Heart burn   . Recurrent UTI   . Heart murmur     Surgical History: Past Surgical History  Procedure Laterality Date  .  Cholecystectomy    . Breast biopsy  Left 1987  . Dilation and curettage of uterus  2005    Home Medications:    Medication List       This list is accurate as of: 12/23/15  4:17 PM.  Always use your most recent med list.               Ascorbic Acid Powd  by Does not apply route. Reported on 11/29/2015     CENTRUM SILVER PO  Take by mouth.     D-Mannose Powd  Take by mouth.     DIGEST ENZYMES-ANTICHOLINERGIC PO  Take by mouth.     KRILL OIL PLUS Caps  Take by mouth.     [place] PRO-B PO  Take by mouth.        Allergies:  Allergies  Allergen Reactions  . Cephalosporins Itching  . Dairy Aid [Lactase]   . Eggs Or Egg-Derived Products   . Sulfamethoxazole-Trimethoprim Itching    Family History: Family History  Problem Relation Age of Onset  . Kidney disease Neg Hx   . Bladder Cancer Neg Hx     Social History:  reports that she has never smoked. She does not have any smokeless tobacco history on file. She reports that she does not drink alcohol or use illicit drugs.  ROS:  Physical Exam: BP 144/72 mmHg  Ht 5\' 5"  (1.651 m)  Wt 237 lb 3.2 oz (107.593 kg)  BMI 39.47 kg/m2  Constitutional:  Alert and oriented, No acute distress. HEENT: [place] AT, moist mucus membranes.  Trachea midline, no masses. Cardiovascular: No clubbing, cyanosis, or edema. Respiratory: Normal respiratory effort, no increased work of breathing. GI: Abdomen is soft, nontender, nondistended, no abdominal masses GU: No CVA tenderness.  Skin: No rashes, bruises or suspicious lesions. Lymph: No cervical or inguinal adenopathy. Neurologic: Grossly intact, no focal deficits, moving all 4 extremities. Psychiatric: Normal mood and affect.  Laboratory Data: No results found for: WBC, HGB, HCT, MCV, PLT  Lab Results  Component Value Date   CREATININE 1.26* 11/29/2015    No results found for: PSA  No results found for:  TESTOSTERONE  No results found for: HGBA1C  Urinalysis    Component Value Date/Time   APPEARANCEUR Clear 11/29/2015 1501   GLUCOSEU Negative 11/29/2015 1501   BILIRUBINUR Negative 11/29/2015 1501   PROTEINUR 1+* 11/29/2015 1501   NITRITE Negative 11/29/2015 1501   LEUKOCYTESUR Negative 11/29/2015 1501    Cystoscopy Procedure Note  Patient identification was confirmed, informed consent was obtained, and patient was prepped using Betadine solution.  Lidocaine jelly was administered per urethral meatus.    Preoperative abx where received prior to procedure.    Procedure: - Flexible cystoscope introduced, without any difficulty.   - Thorough search of the bladder revealed:    normal urethral meatus    normal urothelium    no stones    no ulcers     no tumors    no urethral polyps    no trabeculation  - Ureteral orifices were normal in position and appearance.  Post-Procedure: - Patient tolerated the procedure well   Assessment & Plan:   1. Microscopic hematuria Negative work up. Follow up in one year for repeat urinalysis.  2. Recurrent UTI:  No signs vaginal atrophy. Will continue to monitor. Patient to contact office if she develops symptoms of UTI.  3. AKI vs CKD -No sign of hydronephrosis. Patient referred to nephrology. Appointment pending.   Return in about 1 year (around 12/22/2016) for repeat urinalysis.  Nickie Retort, MD  [facility] [address] [place], [place] [address] [phone]

## 2015-12-26 ENCOUNTER — Encounter: Payer: Self-pay | Admitting: General Surgery

## 2015-12-26 ENCOUNTER — Ambulatory Visit (INDEPENDENT_AMBULATORY_CARE_PROVIDER_SITE_OTHER): Payer: 59 | Admitting: General Surgery

## 2015-12-26 VITALS — BP 128/72 | HR 78 | Resp 12 | Ht 65.0 in | Wt 239.0 lb

## 2015-12-26 DIAGNOSIS — N6320 Unspecified lump in the left breast, unspecified quadrant: Secondary | ICD-10-CM

## 2015-12-26 DIAGNOSIS — N63 Unspecified lump in breast: Secondary | ICD-10-CM

## 2015-12-26 NOTE — Patient Instructions (Addendum)
Continue self breast exams. Call office for any new breast issues or concerns. Patient to return as needed,

## 2015-12-26 NOTE — Progress Notes (Signed)
Patient ID: Alexandra Rios, female   DOB: 1955-12-09, 60 y.o.   MRN: [id number]  Chief Complaint  Patient presents with  . Routine Post Op    HPI Alexandra Rios is a 60 y.o. female here for a follow up for a left breast biopsy done on 11/29/15.  Workup for hematuria and moderate change in renal function underway. The patient was accompanied by her husband. HPI  Past Medical History  Diagnosis Date  . Arthritis     viral  . Heart burn   . Recurrent UTI   . Heart murmur     Past Surgical History  Procedure Laterality Date  . Cholecystectomy    . Breast biopsy  Left 1987  . Dilation and curettage of uterus  2005    Family History  Problem Relation Age of Onset  . Kidney disease Neg Hx   . Bladder Cancer Neg Hx     Social History Social History  Substance Use Topics  . Smoking status: Never Smoker   . Smokeless tobacco: None  . Alcohol Use: No    Allergies  Allergen Reactions  . Cephalosporins Itching  . Dairy Aid [Lactase]   . Eggs Or Egg-Derived Products   . Sulfamethoxazole-Trimethoprim Itching    Current Outpatient Prescriptions  Medication Sig Dispense Refill  . Ascorbic Acid POWD by Does not apply route. Reported on 11/29/2015    . D-Mannose POWD Take by mouth.    . DIGEST ENZYMES-ANTICHOLINERGIC PO Take by mouth.    . Fish Oil-Krill Oil (KRILL OIL PLUS) CAPS Take by mouth.    . Lactobacillus ([place] PRO-B PO) Take by mouth.    . Multiple Vitamins-Minerals (CENTRUM SILVER PO) Take by mouth.     No current facility-administered medications for this visit.    Review of Systems Review of Systems  Constitutional: Negative.   Respiratory: Negative.   Cardiovascular: Negative.     Blood pressure 128/72, pulse 78, resp. rate 12, height 5\' 5"  (1.651 m), weight 239 lb (108.41 kg).  Physical Exam Physical Exam  Constitutional: She is oriented to person, place, and time. She appears well-developed and well-nourished.  Eyes: Conjunctivae are normal. No  scleral icterus.  Neck: Neck supple.  Cardiovascular: Normal rate, regular rhythm and normal heart sounds.   Pulmonary/Chest: Effort normal and breath sounds normal. Right breast exhibits no inverted nipple, no mass, no nipple discharge, no skin change and no tenderness. Left breast exhibits no inverted nipple, no mass, no nipple discharge, no skin change and no tenderness.    Left breast well healed biopsy site. No bruising noted.  Lymphadenopathy:    She has no cervical adenopathy.    She has no axillary adenopathy.  Neurological: She is alert and oriented to person, place, and time.  Skin: Skin is warm and dry.  Psychiatric: She has a normal mood and affect.    Data Reviewed The CT of 12/02/2015 reviewed. Report of mesentery edema reviewed. No discernible abnormality on my review the films.   Assessment    Biopsy of the left breast showing fibrocystic changes. Benign clinical exam.     Plan    Patient will continue anal screening mammograms.  Patient to return as needed.  PCP: Fulton Reek This has been scribed by Lesly Rubenstein LPN    Robert Bellow 12/26/2015, 8:58 PM

## 2015-12-28 ENCOUNTER — Ambulatory Visit: Payer: 59 | Attending: Urology | Admitting: Physical Therapy

## 2015-12-28 DIAGNOSIS — R293 Abnormal posture: Secondary | ICD-10-CM

## 2015-12-28 DIAGNOSIS — R279 Unspecified lack of coordination: Secondary | ICD-10-CM | POA: Insufficient documentation

## 2015-12-29 NOTE — Patient Instructions (Signed)
  Hold off on sit-ups/crunches in workout routine                                                      Preserve the function of your pelvic floor, abdomen, and back.              Avoid decreased straining of abdominal/pelvic floor muscles with less slouching,  holding your breath with lifting/bowel movements)                                                     FUNCTIONAL POSTURES

## 2015-12-29 NOTE — Therapy (Signed)
[place] MAIN [facility] SERVICES [address] [place], [place], [address] Phone: [phone]   Fax:  [phone]  Physical Therapy Evaluation  Patient Details  Name: Alexandra Rios MRN: [id number] Date of Birth: August 01, 1956 Referring Provider: Zara Council, MD  Encounter Date: 12/28/2015      PT End of Session - 12/29/15 0855    Visit Number 1   Number of Visits 12   Date for PT Re-Evaluation 03/28/16   Authorization Type g-code 1/10   PT Start Time 1110   PT Stop Time 1210   PT Time Calculation (min) 60 min   Activity Tolerance Patient tolerated treatment well;No increased pain   Behavior During Therapy [facility] for tasks assessed/performed      Past Medical History  Diagnosis Date  . Arthritis     viral  . Heart burn   . Recurrent UTI   . Heart murmur     Past Surgical History  Procedure Laterality Date  . Cholecystectomy    . Breast biopsy  Left 1987  . Dilation and curettage of uterus  2005    There were no vitals filed for this visit.       Subjective Assessment - 12/28/15 1117    Subjective 1) incomplete emptying with urination: Pt reported she strains to completely finish urination and feels she is not completely empty.  Pt reported every once in a "blue moon" pt would get a drop of urinary leakage. 2) recurrent UTI: 4 episodes across the past 4 months.    Pertinent History Current exercise, jog in place, jumping jacks, sit-ups 35 reps,  4-5x/ week. Pt resolved indigestion, viral arthritis, food allergies after pt started the GAPS diet.    Patient Stated Goals get rid of prolapse             [facility] PT Assessment - 12/29/15 0848    Assessment   Medical Diagnosis cystocele    Referring Provider Zara Council, MD   Precautions   Precautions None   Restrictions   Weight Bearing Restrictions No   Balance Screen   Has the patient fallen in the past 6 months No   Prior Function   Level of Independence  Independent   Observation/Other Assessments   Observations increased lumbar lordosis in standing, hyperextended knees   Other Surveys  --  PFDI 26%   Coordination   Gross Motor Movements are Fluid and Coordinated --  abdominal straining w/ cue for bowel movement   Fine Motor Movements are Fluid and Coordinated --  demo'd ability to engage TrA/pelvic floor (lift) in standing   Jumping   Comments SLS jumping jacks, unable to maintain balance with coordination of exhalation w/ arm movements   Sit to Stand   Comments breathholding, poor mechanics   Posture/Postural Control   Posture Comments lumbopelvic instability w/  hip flexion                 Pelvic Floor Special Questions - 12/29/15 0001    External Perineal Exam pt consented verbally with undergarment doffed to assess in standing position   Prolapse other unable to assess due to increased adipose tissue. Plan to assess internally in hooklying and standing at next session          [facility] Adult PT Treatment/Exercise - 12/29/15 0848    Therapeutic Activites    Therapeutic Activities --  decrease downward forces on pelvic floor see pt instructions   Neuro Re-ed    Neuro  Re-ed Details  eduation on pelvic floor anatomy/physiology   breathing coordination with bowel movement                     PT Long Term Goals - 12/29/15 0903    PT LONG TERM GOAL #1   Title Pt will report she will be able to complete empty her urine > 75% of the time in order to improve her ADLs.    Time 12   Period Weeks   Status New   PT LONG TERM GOAL #2   Title Pt will decrease her PFDI score from 26% to < 16% in order to demo improve pelvic floor function   Time 12   Period Weeks   Status New   PT LONG TERM GOAL #3   Title Pt will demo proper body mechanics with house hold chores and exercises without cuing in order to minimize worsening of prolpase Sx.   Time 12   Period Weeks   Status New               Plan -  12/29/15 0856    Clinical Impression Statement Pt is a 60 yo female who complains of difficulty with completing urination in addition to recurrent UTI episodes, occasional SUI. Pt's clinical presentations include poor coordination of pelvic floor/abdominal/ diaphragm muscles in functional activities such as bowel movements and against gravity transfers which may be  contributing factor to her cystocele Dx. Pt also presented with increased lumbar lordosis in standing, poor postural stability with her exercise routine, and poor knowledge of proper exercises and body mechanics to minimize worsening of prolapse. Pt will be assessed w/ internal pelvic floor exam with consent in order to begin proper pelvic floor/deep core strengthening program, and gain skilled guidance to modify her workout routine, and body mechanics in order to minimize her risk for further pelvic floor dysfunction. Pt will benefit from skilled PT.    Rehab Potential Good   PT Frequency 1x / week   PT Duration 12 weeks   PT Treatment/Interventions ADLs/Self Care Home Management;Aquatic Therapy;Gait training;Stair training;Functional mobility training;Moist Heat;Therapeutic activities;Therapeutic exercise;Balance training;Neuromuscular re-education;Manual techniques;Patient/family education;   Consulted and Agree with Plan of Care Patient      Patient will benefit from skilled therapeutic intervention in order to improve the following deficits and impairments:  Abnormal gait, Decreased balance, Decreased safety awareness, Decreased strength, Decreased endurance, Decreased activity tolerance, Decreased coordination, Decreased mobility, Postural dysfunction, Improper body mechanics  Visit Diagnosis: Unspecified lack of coordination  Abnormal posture      G-Codes - 2016-01-25 2040/01/01    Functional Assessment Tool Used PFDI  26%    Functional Limitation Self care   Self Care Current Status [id number]) At least 20 percent but less than 40  percent impaired, limited or restricted   Self Care Goal Status [id number]) At least 1 percent but less than 20 percent impaired, limited or restricted       Problem List Patient Active Problem List   Diagnosis Date Noted  . Recurrent UTI 12/03/2015  . Microscopic hematuria 12/03/2015  . Cystocele, grade 2 12/03/2015  . Elevated serum creatinine 12/03/2015  . Left breast mass 11/28/2015  . Absolute anemia 08/30/2015  . BP (high blood pressure) 08/30/2015  . Headache, migraine 08/30/2015  . Adiposity 08/30/2015  . Frequent UTI 08/30/2015    Jerl Mina ,PT, DPT, E-RYT  12/29/2015, 9:38 AM  [place] MAIN [facility] SERVICES [place]  [place], [place], [address] Phone: [phone]   Fax:  [phone]  Name: Alexandra Rios MRN: [id number] Date of Birth: 10-21-1955

## 2015-12-30 ENCOUNTER — Other Ambulatory Visit: Payer: 59

## 2015-12-30 DIAGNOSIS — N390 Urinary tract infection, site not specified: Secondary | ICD-10-CM

## 2015-12-30 LAB — MICROSCOPIC EXAMINATION

## 2015-12-30 LAB — URINALYSIS, COMPLETE
Bilirubin, UA: NEGATIVE
Glucose, UA: NEGATIVE
Ketones, UA: NEGATIVE
Nitrite, UA: POSITIVE — AB
PH UA: 6.5 (ref 5.0–7.5)
PROTEIN UA: NEGATIVE
Specific Gravity, UA: 1.02 (ref 1.005–1.030)
UUROB: 0.2 mg/dL (ref 0.2–1.0)

## 2015-12-30 MED ORDER — NITROFURANTOIN MACROCRYSTAL 100 MG PO CAPS: 100.0000 mg | ORAL_CAPSULE | Freq: Four times a day (QID) | ORAL | Status: DC

## 2015-12-30 NOTE — Progress Notes (Signed)
Per Dr. Alyson Ingles pt was given macrodantin qid x10days. Pt made aware.

## 2016-01-02 LAB — CULTURE, URINE COMPREHENSIVE

## 2016-01-04 ENCOUNTER — Ambulatory Visit: Payer: 59 | Attending: Urology | Admitting: Physical Therapy

## 2016-01-04 DIAGNOSIS — R293 Abnormal posture: Secondary | ICD-10-CM | POA: Insufficient documentation

## 2016-01-04 DIAGNOSIS — R279 Unspecified lack of coordination: Secondary | ICD-10-CM | POA: Diagnosis present

## 2016-01-04 NOTE — Therapy (Signed)
[place] MAIN [facility] SERVICES [address] [place], [place], [address] Phone: [phone]   Fax:  [phone]  Physical Therapy Treatment  Patient Details  Name: Alexandra Rios MRN: [id number] Date of Birth: [birth year] Referring Provider: Zara Council, MD  Encounter Date: 01/04/2016      PT End of Session - 01/04/16 1007    Visit Number 2   Number of Visits 12   Date for PT Re-Evaluation 03/28/16   Authorization Type g-code 2/10   PT Start Time 0905   PT Stop Time 1000   PT Time Calculation (min) 55 min   Activity Tolerance Patient tolerated treatment well;No increased pain   Behavior During Therapy [facility] for tasks assessed/performed      Past Medical History  Diagnosis Date  . Arthritis     viral  . Heart burn   . Recurrent UTI   . Heart murmur     Past Surgical History  Procedure Laterality Date  . Cholecystectomy    . Breast biopsy  Left 1987  . Dilation and curettage of uterus  2005    There were no vitals filed for this visit.      Subjective Assessment - 01/04/16 0913    Subjective Pt has been practicing the correct breathing with toileting but has some quesitons about it.    Pertinent History Current exercise, jog in place, jumping jacks, sit-ups 35 reps,  4-5x/ week. Pt resolved indigestion, viral arthritis, food allergies after pt started the GAPS diet.    Patient Stated Goals get rid of prolapse             [facility] PT Assessment - 01/04/16 1005    Observation/Other Assessments   Observations With training, pt was able to find pelvic neutral with cues to correct locked knees     Lunges/ floor to rise   Comments no LOB and uses UE support with cue for alignment                     [facility] Adult PT Treatment/Exercise - 01/04/16 1005    Therapeutic Activites    Therapeutic Activities --  see  pt instructions (body mechanics with household chores and gardening)                       PT Long Term Goals - 12/29/15 0903    PT LONG TERM GOAL #1   Title Pt will report she will be able to complete empty her urine > 75% of the time in order to improve her ADLs.    Time 12   Period Weeks   Status New   PT LONG TERM GOAL #2   Title Pt will decrease her PFDI score from 26% to < 16% in order to demo improve pelvic floor function   Time 12   Period Weeks   Status New   PT LONG TERM GOAL #3   Title Pt will demo proper body mechanics with house hold chores and exercises without cuing in order to minimize worsening of prolpase Sx.   Time 12   Period Weeks   Status New               Plan - 01/04/16 1007    Clinical Impression Statement Pt demo'd proper body mechanics w/ standing posture,  household/gardening activities and floor <> rise t/f in order to minimize worsening of prolpase Sx. Pt continues to benefit from skilled PT.  Rehab Potential Good   PT Frequency 1x / week   PT Duration 12 weeks   PT Treatment/Interventions ADLs/Self Care Home Management;Aquatic Therapy;Gait training;Stair training;Functional mobility training;Moist Heat;Therapeutic activities;Therapeutic exercise;Balance training;Neuromuscular re-education;Manual techniques;Patient/family education;Scar mobilization   Consulted and Agree with Plan of Care Patient      Patient will benefit from skilled therapeutic intervention in order to improve the following deficits and impairments:  Abnormal gait, Decreased balance, Decreased safety awareness, Decreased strength, Decreased endurance, Decreased activity tolerance, Decreased coordination, Decreased mobility, Postural dysfunction, Improper body mechanics  Visit Diagnosis: Unspecified lack of coordination  Abnormal posture     Problem List Patient Active Problem List   Diagnosis Date Noted  . Recurrent UTI 12/03/2015  . Microscopic hematuria 12/03/2015  . Cystocele, grade 2 12/03/2015  . Elevated serum creatinine 12/03/2015  . Left  breast mass 11/28/2015  . Absolute anemia 08/30/2015  . BP (high blood pressure) 08/30/2015  . Headache, migraine 08/30/2015  . Adiposity 08/30/2015  . Frequent UTI 08/30/2015    Jerl Mina ,PT, DPT, E-RYT 01/04/2016, 10:08 AM  [place] MAIN [facility] SERVICES [address] [place], [place], [address] Phone: [phone]   Fax:  [phone]  Name: Alexandra Rios MRN: [id number] Date of Birth: 1956-06-18

## 2016-01-04 NOTE — Patient Instructions (Signed)
Body mechanics with household chores and gardening (handout)  Standing posture (handout)   Paced breathing 1-2 pause with toileting, feet propped on foot stool

## 2016-01-11 ENCOUNTER — Ambulatory Visit: Payer: 59 | Admitting: Physical Therapy

## 2016-01-11 DIAGNOSIS — R279 Unspecified lack of coordination: Secondary | ICD-10-CM

## 2016-01-11 DIAGNOSIS — R293 Abnormal posture: Secondary | ICD-10-CM

## 2016-01-11 NOTE — Patient Instructions (Signed)
You are now ready to begin training the deep core muscles system: diaphragm, transverse abdominis, pelvic floor . These muscles must work together as a team.        The key to these exercises to train the brain to coordinate the timing of these muscles and to have them turn on for long periods of time to hold you upright against gravity (especially important if you are on your feet all day).These muscles are postural muscles and play a role stabilizing your spine and bodyweight. By doing these repetitions slowly and correctly instead of doing crunches, you will achieve a flatter belly without a lower pooch. You are also placing your spine in a more neutral position and breathing properly which in turn, decreases your risk for problems related to your pelvic floor, abdominal, and low back such as pelvic organ prolapse, hernias, diastasis recti (separation of superficial muscles), disk herniations, spinal fractures. These exercises set a solid foundation for you to later progress to resistance/ strength training with therabands and weights and return to other typical fitness exercises with a stronger deeper core.  Expand diaphragm , not push with belly  Wear looser bra strap  (unhook bra with laying down exercise)   Level 2 (30 reps x 2 day)    PELVIC FLOOR / KEGEL EXERCISES   Pelvic floor/ Kegel exercises are used to strengthen the muscles in the base of your pelvis that are responsible for supporting your pelvic organs and preventing urine/feces leakage. Based on your therapist's recommendations, they can be performed while standing, sitting, or lying down.  Make yourself aware of this muscle group by using these cues:  Imagine you are in a crowded room and you feel the need to pass gas. Your response is to pull up and in at the rectum.  Close the rectum. Pull the muscles up inside your body,feeling your scrotum lifting as well . Feel the pelvic floor muscles lift as if you were walking into  a cold lake.  Place your hand on top of your pubic bone. Tighten and draw in the muscles around the anal muscles without squeezing the buttock muscles.  Common Errors:  Breath holding: If you are holding your breath, you may be bearing down against your bladder instead of pulling it up. If you belly bulges up while you are squeezing, you are holding your breath. Be sure to breathe gently in and out while exercising. Counting out loud may help you avoid holding your breath.  Accessory muscle use: You should not see or feel other muscle movement when performing pelvic floor exercises. When done properly, no one can tell that you are performing the exercises. Keep the buttocks, belly and inner thighs relaxed.  Overdoing it: Your muscles can fatigue and stop working for you if you over-exercise. You may actually leak more or feel soreness at the lower abdomen or rectum.  YOUR HOME EXERCISE PROGRAM  LONG HOLDS: Position: on back  Inhale and then exhale. Then squeeze the muscle and count aloud for 5 seconds. Rest with three long breaths. (Be sure to let belly sink in with exhales and not push outward)  Perform 5 repetitions, 5 times/day    **ALSO SQUEEZE BEFORE YOUR SNEEZE, COUGH, LAUGH to decrease downward pressure   **ALSO EXHALE BEFORE YOU RISE AGAINST GRAVITY (lifting, sit to stand, from squat to stand)

## 2016-01-12 NOTE — Therapy (Signed)
[place] MAIN [facility] SERVICES [address] [place], [place], [address] Phone: [phone]   Fax:  [phone]  Physical Therapy Treatment  Patient Details  Name: Alexandra Rios MRN: [id number] Date of Birth: 07-Apr-1956 Referring Provider: Zara Council, MD  Encounter Date: 01/11/2016      PT End of Session - 01/12/16 1319    Visit Number 3   Number of Visits 12   Date for PT Re-Evaluation 03/28/16   Authorization Type g-code 3/10   PT Start Time 0907   PT Stop Time 1000   PT Time Calculation (min) 53 min   Activity Tolerance Patient tolerated treatment well;No increased pain   Behavior During Therapy [facility] for tasks assessed/performed      Past Medical History  Diagnosis Date  . Arthritis     viral  . Rios burn   . Recurrent UTI   . Rios murmur     Past Surgical History  Procedure Laterality Date  . Cholecystectomy    . Breast biopsy  Left 1987  . Dilation and curettage of uterus  2005    There were no vitals filed for this visit.      Subjective Assessment - 01/11/16 0913    Subjective Pt reported performing the body mechanics with household chores and entertaining her family guests over the weekend. Pt limited spinal flexion with cleaning and she did not have any back pain like she usually did after performing chores.                       Pelvic Floor Special Questions - 01/12/16 1318    Prolapse other hooklying, bladder in more caudal position post Tx, no pillow required   Pelvic Floor Internal Exam Pt consented verbally without contraindications    Exam Type Vaginal   Strength fair squeeze, definite lift  initially w. bladder limited circumferential closure   Strength # of reps 10   Strength # of seconds 2   Biofeedback no pillow required, HEP 5 reps, 5 sec, 5 x day  post Tx, 3/5 with circumferential closure, bladder caudal            [facility] Adult PT Treatment/Exercise - 01/12/16 1318     Neuro Re-ed    Neuro Re-ed Details  see pt instructions                PT Education - 01/12/16 1318    Education provided Yes   Education Details HEP   Person(s) Educated Patient   Methods Explanation;Demonstration;Tactile cues;Verbal cues;Handout   Comprehension Returned demonstration;Verbalized understanding             PT Long Term Goals - 12/29/15 0903    PT LONG TERM GOAL #1   Title Pt will report she will be able to complete empty her urine > 75% of the time in order to improve her ADLs.    Time 12   Period Weeks   Status New   PT LONG TERM GOAL #2   Title Pt will decrease her PFDI score from 26% to < 16% in order to demo improve pelvic floor function   Time 12   Period Weeks   Status New   PT LONG TERM GOAL #3   Title Pt will demo proper body mechanics with house hold chores and exercises without cuing in order to minimize worsening of prolpase Sx.   Time 12   Period Weeks   Status New  Plan - 01/12/16 1319    Clinical Impression Statement Pt progressed to deep core strengthening and her pelvic assessment showed proper coordination but low endurance. Answered pt's questions and provided pt education and explanations on how deep core training will be gradually applied in upright positions with her regular exercises and with singing. Pt showed improper diaphragmatic breathing (chest dominate) in standing and simulated singing position and will require more coordination training in gravity eliminated position for a few more sessions before progressing to upright training. Pt will continue to benefit skilled PT.   Rehab Potential Good   PT Frequency 1x / week   PT Duration 12 weeks   PT Treatment/Interventions ADLs/Self Care Home Management;Aquatic Therapy;Gait training;Stair training;Functional mobility training;Moist Heat;Therapeutic activities;Therapeutic exercise;Balance training;Neuromuscular re-education;Manual  techniques;Patient/family education;Scar mobilization   Consulted and Agree with Plan of Care Patient      Patient will benefit from skilled therapeutic intervention in order to improve the following deficits and impairments:  Abnormal gait, Decreased balance, Decreased safety awareness, Decreased strength, Decreased endurance, Decreased activity tolerance, Decreased coordination, Decreased mobility, Postural dysfunction, Improper body mechanics  Visit Diagnosis: Abnormal posture  Unspecified lack of coordination     Problem List Patient Active Problem List   Diagnosis Date Noted  . Recurrent UTI 12/03/2015  . Microscopic hematuria 12/03/2015  . Cystocele, grade 2 12/03/2015  . Elevated serum creatinine 12/03/2015  . Left breast mass 11/28/2015  . Absolute anemia 08/30/2015  . BP (high blood pressure) 08/30/2015  . Headache, migraine 08/30/2015  . Adiposity 08/30/2015  . Frequent UTI 08/30/2015    Jerl Mina ,PT, DPT, E-RYT  01/12/2016, 1:21 PM  [place] MAIN [facility] SERVICES [address] [place], [place], [address] Phone: [phone]   Fax:  [phone]  Name: EVADEAN SAVARIA MRN: [id number] Date of Birth: 24-Mar-1956

## 2016-02-02 ENCOUNTER — Ambulatory Visit: Payer: 59 | Attending: Urology | Admitting: Physical Therapy

## 2016-02-02 DIAGNOSIS — R293 Abnormal posture: Secondary | ICD-10-CM | POA: Insufficient documentation

## 2016-02-02 DIAGNOSIS — R279 Unspecified lack of coordination: Secondary | ICD-10-CM | POA: Diagnosis present

## 2016-02-02 NOTE — Patient Instructions (Addendum)
Deep core level  Level 2 (30 reps x 2 x day )  Inhale,  Exhale one knee out and in   Inhale exhale other knee out and in  ___________  Replace situps in vidoes With bridges   __________  Diaphragmatic breathing seated with towel around ribs for expansion  10 x 2 reps   __________ Singing:  Inhale with ribs expansion gently , not grabbing the air so fast Exhale with feel belly hug and pelvic floor lift with release of voice   __________ One week Pelvic floor holds 7 sec,. 3 x days  Next week 10 sec, x 3 x day (4 breaths rest)

## 2016-02-02 NOTE — Therapy (Signed)
[place] MAIN [facility] SERVICES [address] [place], [place], [address] Phone: [phone]   Fax:  [phone]  Physical Therapy Treatment  Patient Details  Name: Alexandra Rios MRN: [id number] Date of Birth: 1956-05-16 Referring Provider: Zara Council, MD  Encounter Date: 02/02/2016      PT End of Session - 02/02/16 1606    Visit Number 4   Number of Visits 12   Date for PT Re-Evaluation 03/28/16   Authorization Type g-code 4/10   PT Start Time 1440   PT Stop Time 1530   PT Time Calculation (min) 50 min   Activity Tolerance Patient tolerated treatment well;No increased pain   Behavior During Therapy [facility] for tasks assessed/performed      Past Medical History  Diagnosis Date  . Arthritis     viral  . Heart burn   . Recurrent UTI   . Heart murmur     Past Surgical History  Procedure Laterality Date  . Cholecystectomy    . Breast biopsy  Left 1987  . Dilation and curettage of uterus  2005    There were no vitals filed for this visit.      Subjective Assessment - 02/02/16 1557    Subjective Pt reported she had a UTI for a week and a half. Pt reported she felt confused with her pelvic floor activation because she is used to doing the opposite with singing. 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            [facility] PT Assessment - 02/02/16 1602    Coordination   Coordination and Movement Description chest breathing during inspiration after singing one phrase, initially with bearing down of pelvic floor mm on expiration with singing  required correction of sequence with deep core level 2                     [facility] Adult PT Treatment/Exercise - 02/02/16 1605    Therapeutic Activites    Therapeutic Activities --  singing with deep core coordination, seated, standing   Neuro Re-ed    Neuro Re-ed Details  deep core level 2 coordination, pelvic floor progression principles, ddep core and glotis  coordination with singing, diaphragmatic breathing with AAROM using a towel 10 reps                  PT Education - 02/02/16 1606    Education provided Yes   Education Details HEP   Person(s) Educated Patient   Methods Explanation;Demonstration;Verbal cues;Handout;Tactile cues   Comprehension Returned demonstration;Verbalized understanding             PT Long Term Goals - 02/02/16 1447    PT LONG TERM GOAL #1   Title Pt will report she will be able to complete empty her urine > 75% of the time in order to improve her ADLs.    Time 12   Period Weeks   Status On-going   PT LONG TERM GOAL #2   Title Pt will decrease her PFDI score from 26% to < 16% in order to demo improve pelvic floor function   Time 12   Period Weeks   Status On-going   PT LONG TERM GOAL #3   Title Pt will demo proper body mechanics with house hold chores and exercises without cuing in order to minimize worsening of prolpase Sx.   Time 12   Period Weeks   Status Achieved  Plan - 02/02/16 1607    Clinical Impression Statement Pt demo'd proper coordination of pelvic floor/ diaphragmatic breathing while singing after neuromuscular training. Pt was able to minimize chest breathing and optimize ribcage expansion with AAROM exercise. Pt required corrections on technique on deep core level 2 exercise and was educated on replacing sit ups in her self selected routine with bridges. Pt is progressing well towards her goals and understanding how to minimize strain on her pelvic floor mm and is strengthening them in a progressive manner. Will reassess internally at next session and investigate on her issue with  incomplete emptying of urine.    Rehab Potential Good   PT Frequency 1x / week   PT Duration 12 weeks   PT Treatment/Interventions ADLs/Self Care Home Management;Aquatic Therapy;Gait training;Stair training;Functional mobility training;Moist Heat;Therapeutic activities;Therapeutic  exercise;Balance training;Neuromuscular re-education;Manual techniques;Patient/family education;Scar mobilization   Consulted and Agree with Plan of Care Patient      Patient will benefit from skilled therapeutic intervention in order to improve the following deficits and impairments:  Abnormal gait, Decreased balance, Decreased safety awareness, Decreased strength, Decreased endurance, Decreased activity tolerance, Decreased coordination, Decreased mobility, Postural dysfunction, Improper body mechanics  Visit Diagnosis: Abnormal posture  Unspecified lack of coordination     Problem List Patient Active Problem List   Diagnosis Date Noted  . Recurrent UTI 12/03/2015  . Microscopic hematuria 12/03/2015  . Cystocele, grade 2 12/03/2015  . Elevated serum creatinine 12/03/2015  . Left breast mass 11/28/2015  . Absolute anemia 08/30/2015  . BP (high blood pressure) 08/30/2015  . Headache, migraine 08/30/2015  . Adiposity 08/30/2015  . Frequent UTI 08/30/2015    Jerl Mina ,PT, DPT, E-RYT  02/02/2016, 4:09 PM  [place] MAIN [facility] SERVICES [address] [place], [place], [address] Phone: [phone]   Fax:  [phone]  Name: Alexandra Rios MRN: [id number] Date of Birth: [birth year]

## 2016-02-15 ENCOUNTER — Ambulatory Visit: Payer: 59 | Admitting: Physical Therapy

## 2016-02-15 DIAGNOSIS — R293 Abnormal posture: Secondary | ICD-10-CM

## 2016-02-15 DIAGNOSIS — R279 Unspecified lack of coordination: Secondary | ICD-10-CM

## 2016-02-15 NOTE — Therapy (Signed)
[place] MAIN [facility] SERVICES [address] [place], [place], [address] Phone: [phone]   Fax:  [phone]  Physical Therapy Treatment  Patient Details  Name: Alexandra Rios MRN: [id number] Date of Birth: 1956-06-28 Referring Provider: Zara Council, MD  Encounter Date: 02/15/2016      PT End of Session - 02/15/16 2255    Visit Number 5   Number of Visits 12   Date for PT Re-Evaluation 03/28/16   Authorization Type g-code 5/10   PT Start Time 1100   PT Stop Time 1210   PT Time Calculation (min) 70 min   Activity Tolerance Patient tolerated treatment well;No increased pain   Behavior During Therapy [facility] for tasks assessed/performed      Past Medical History  Diagnosis Date  . Arthritis     viral  . Heart burn   . Recurrent UTI   . Heart murmur     Past Surgical History  Procedure Laterality Date  . Cholecystectomy    . Breast biopsy  Left 1987  . Dilation and curettage of uterus  2005    There were no vitals filed for this visit.      Subjective Assessment - 02/15/16 1105    Subjective Pt has been more conscious with using the pelvic floor when singing through diaphragmatic breathing. Pt states she notices her legs are getting stronger with the exercises. Pt has been busy with home renovations and has not been doing her self-selected fitness routine.             [facility] PT Assessment - 02/15/16 2247    Observation/Other Assessments   Observations technique for gardening positions with poor alignment and self-selected fitness routine with excessive lumbopelvic perturbations                  Pelvic Floor Special Questions - 02/15/16 2246    Pelvic Floor Internal Exam Pt consented verbally without infections   Exam Type Vaginal   Strength fair squeeze, definite lift  cued for less adductor overuse   Strength # of reps 10   Strength # of seconds 10           [facility] Adult PT Treatment/Exercise -  02/15/16 2249    Therapeutic Activites    Therapeutic Activities --  gardening positions, floor<> stand t/f    Neuro Re-ed    Neuro Re-ed Details  modifications to self -slected exercises to focus more on lumbopelvic stability and hip/ glut mm strengthening instead of overuse of hip flexor mm                      PT Long Term Goals - 02/15/16 2251    PT LONG TERM GOAL #1   Title Pt will report she will be able to completely empty her urine > 75% of the time in order to improve her ADLs.    Time 12   Period Weeks   Status Achieved   PT LONG TERM GOAL #2   Title Pt will decrease her PFDI score from 26% to < 16% in order to demo improve pelvic floor function   Time 12   Period Weeks   Status On-going   PT LONG TERM GOAL #3   Title Pt will demo proper body mechanics with house hold chores and exercises without cuing in order to minimize worsening of prolpase Sx.   Time 12   Period Weeks   Status Achieved  PT LONG TERM GOAL #4   Title Pt will be able to demo modifications to self-selected fitness routine with proper alignment, form, technique without lumbopelvic perturbations and increased deep core coordination in order to improve pelvic function and decrease risk for injuries.   Time 12   Period Weeks   Status New               Plan - 02/15/16 2256    Clinical Impression Statement Pt shows proper pelvic floor activation with deep core mm system and increased endurance strength. Pt required minor cuing to decrease overuse of adductor mm with pelvic floor activation.After training today, pt demo'd proper body mechanics with gardening positions, floor <> rise t/f to minimize downward pressure on pelvic floor.  Pt also demo'd proper technique with modifications to self-selected fitness exercises to demo  increased lumbopelvic stability and proper UE/LE kinetic chain alignment.  Pt  is progressing well towards her goals.   Rehab Potential Good   PT Frequency 1x / week    PT Duration 12 weeks   PT Treatment/Interventions ADLs/Self Care Home Management;Aquatic Therapy;Gait training;Stair training;Functional mobility training;Moist Heat;Therapeutic activities;Therapeutic exercise;Balance training;Neuromuscular re-education;Manual techniques;Patient/family education;Scar mobilization   Consulted and Agree with Plan of Care Patient      Patient will benefit from skilled therapeutic intervention in order to improve the following deficits and impairments:  Abnormal gait, Decreased balance, Decreased safety awareness, Decreased strength, Decreased endurance, Decreased activity tolerance, Decreased coordination, Decreased mobility, Postural dysfunction, Improper body mechanics  Visit Diagnosis: Abnormal posture  Unspecified lack of coordination     Problem List Patient Active Problem List   Diagnosis Date Noted  . Recurrent UTI 12/03/2015  . Microscopic hematuria 12/03/2015  . Cystocele, grade 2 12/03/2015  . Elevated serum creatinine 12/03/2015  . Left breast mass 11/28/2015  . Absolute anemia 08/30/2015  . BP (high blood pressure) 08/30/2015  . Headache, migraine 08/30/2015  . Adiposity 08/30/2015  . Frequent UTI 08/30/2015    Jerl Mina ,PT, DPT, E-RYT  02/15/2016, 10:58 PM  [place] MAIN [facility] SERVICES [address] [place], [place], [address] Phone: [phone]   Fax:  [phone]  Name: SHALAY HEIDEL MRN: [id number] Date of Birth: 10-29-1955

## 2016-02-15 NOTE — Patient Instructions (Addendum)
Deep core level 2 (knee out) smaller movements, stable trunk  30 reps x 2 x day     Modifications to fitness routine video:  1. Sidelying: knees slightly  Instead of thigh in, back -->   Move knee up towards ceiling on exhale and then back  ~15 deg   10x 2 x 2 xday   2a . Table top position (imagine keeping plate on your back to create trunk stability) and spider hands and tucked toes Take out the thigh, just kick back without spilling plate.  Toes point down   2b. Leg Out and back  (smaller range of movement to keep imaginary plate on your back)   10x 2 x 2 xday    3. Pelvic floor endurance exercises  10 sec, 10 reps x 2  Focus on using less inner thighs

## 2016-02-29 ENCOUNTER — Ambulatory Visit: Payer: 59 | Admitting: Physical Therapy

## 2016-02-29 DIAGNOSIS — R293 Abnormal posture: Secondary | ICD-10-CM | POA: Diagnosis not present

## 2016-02-29 DIAGNOSIS — R279 Unspecified lack of coordination: Secondary | ICD-10-CM

## 2016-02-29 NOTE — Patient Instructions (Addendum)
   Tree pose  To help with balance on R leg    Warrior III   To help with balance on R leg and help to decrease downward forces onto pelvic floor   ( when picking light object)    Clam shells 10x 3  Followed by  figure four stretch seated (R ankle up over opposite thigh, lean forward)  5 breaths     Seated twist towards bend knee side R ankle over, R hand behind R hip , L hand pulling R knee gently towards L armpit,   Look over R shoulder, inhale to lengthen spine, exhale relax shoulders  5 breaths    Holding mixer and transporting to another table off washer Using semi tandem stance and holding close to side rather than feet next to each other

## 2016-03-01 NOTE — Therapy (Signed)
[place] MAIN [facility] SERVICES [address] [place], [place], [address] Phone: [phone]   Fax:  [phone]  Physical Therapy Treatment  Patient Details  Name: Alexandra Rios MRN: [id number] Date of Birth: 05-18-1956 Referring Provider: Zara Council, MD  Encounter Date: 02/29/2016      PT End of Session - 03/01/16 1616    Visit Number 6   Number of Visits 12   Date for PT Re-Evaluation 03/28/16   Authorization Type g-code 6/10   PT Start Time 1102   PT Stop Time 1200   PT Time Calculation (min) 58 min   Activity Tolerance Patient tolerated treatment well;No increased pain   Behavior During Therapy [facility] for tasks assessed/performed      Past Medical History  Diagnosis Date  . Arthritis     viral  . Heart burn   . Recurrent UTI   . Heart murmur     Past Surgical History  Procedure Laterality Date  . Cholecystectomy    . Breast biopsy  Left 1987  . Dilation and curettage of uterus  2005    There were no vitals filed for this visit.      Subjective Assessment - 02/29/16 1108    Subjective Pt reported she was gardening, and weeded on her hands and knees for 4 hours. She felt her legs being toned.             [facility] PT Assessment - 03/01/16 1614    Observation/Other Assessments   Observations proper core stability with HEP ex.                      [facility] Adult PT Treatment/Exercise - 03/01/16 1613    Therapeutic Activites    Therapeutic Activities --  see pt instructions   Neuro Re-ed    Neuro Re-ed Details  see pt instructions                 PT Education - 02/29/16 1203    Education provided Yes   Education Details HEP   Person(s) Educated Patient   Methods Explanation;Demonstration;Tactile cues;Verbal cues;Handout   Comprehension Verbalized understanding;Returned demonstration             PT Long Term Goals - 02/15/16 2251    PT LONG TERM GOAL #1   Title Pt will report  she will be able to completely empty her urine > 75% of the time in order to improve her ADLs.    Time 12   Period Weeks   Status Achieved   PT LONG TERM GOAL #2   Title Pt will decrease her PFDI score from 26% to < 16% in order to demo improve pelvic floor function   Time 12   Period Weeks   Status On-going   PT LONG TERM GOAL #3   Title Pt will demo proper body mechanics with house hold chores and exercises without cuing in order to minimize worsening of prolpase Sx.   Time 12   Period Weeks   Status Achieved   PT LONG TERM GOAL #4   Title Pt will be able to demo modifications to self-selected fitness routine with proper alignment, form, technique without lumbopelvic perturbations and increased deep core coordination in order to improve pelvic function and decrease risk for injuries.   Time 12   Period Weeks   Status New               Plan -  03/01/16 1617    Clinical Impression Statement Pt continues to progress well with good carry over of technique and form with her HEP. Pt is close to meeting her goals. Pt will be ready for d/c after 1-2 appointments.    Rehab Potential Good   PT Frequency 1x / week   PT Duration 12 weeks   PT Treatment/Interventions ADLs/Self Care Home Management;Aquatic Therapy;Gait training;Stair training;Functional mobility training;Moist Heat;Therapeutic activities;Therapeutic exercise;Balance training;Neuromuscular re-education;Manual techniques;Patient/family education;Scar mobilization   Consulted and Agree with Plan of Care Patient      Patient will benefit from skilled therapeutic intervention in order to improve the following deficits and impairments:  Abnormal gait, Decreased balance, Decreased safety awareness, Decreased strength, Decreased endurance, Decreased activity tolerance, Decreased coordination, Decreased mobility, Postural dysfunction, Improper body mechanics  Visit Diagnosis: Abnormal posture  Unspecified lack of  coordination     Problem List Patient Active Problem List   Diagnosis Date Noted  . Recurrent UTI 12/03/2015  . Microscopic hematuria 12/03/2015  . Cystocele, grade 2 12/03/2015  . Elevated serum creatinine 12/03/2015  . Left breast mass 11/28/2015  . Absolute anemia 08/30/2015  . BP (high blood pressure) 08/30/2015  . Headache, migraine 08/30/2015  . Adiposity 08/30/2015  . Frequent UTI 08/30/2015    Jerl Mina ,PT, DPT, E-RYT  03/01/2016, 4:19 PM  [place] [facility] MAIN [facility] SERVICES [address] [place], [place], [address] Phone: [phone]   Fax:  [phone]  Name: Alexandra Rios MRN: [id number] Date of Birth: [birth year]

## 2016-03-14 ENCOUNTER — Ambulatory Visit: Payer: 59 | Attending: Urology | Admitting: Physical Therapy

## 2016-03-14 DIAGNOSIS — R293 Abnormal posture: Secondary | ICD-10-CM | POA: Diagnosis present

## 2016-03-14 DIAGNOSIS — R279 Unspecified lack of coordination: Secondary | ICD-10-CM

## 2016-03-14 NOTE — Patient Instructions (Addendum)
Pelvic floor strengthening  Quick holds 5 reps , 5 x during church and other activities   Long holds : 3 reps , 10 sec holds with 10 sec rests spread out throughout the day 5x.  In seated or standing or laying position.When not multi-tasking.     ____________________   Video modification:  Tue and Thurs: slow down the jumping jacks to focus on stead balance, hold for brief sec on one leg  Sidebend movement: focus on rooting into legs and stretching without neck and shoulder tensions   Instead of crunches: deep core level 2-4   Jogging in place: focus on forefoot strengthening (arches and being able to support your body weight against)   Batwing busters: focus on relaxing shoulders and chin back when arms are raised  Instead of twisting the whole body, focus on rooting in legs and hip, turn only ribcage (shooulders relaxed)   Knee to elbow touch: variate between heel down or heel up   Exercise 6 in a circle: stepping out and stepping back in with awareness of foot under hips

## 2016-03-15 NOTE — Therapy (Addendum)
[place] MAIN [facility] SERVICES [address] [place], [place], [address] Phone: [phone]   Fax:  [phone]  Physical Therapy Treatment / Discharge Summary  Patient Details  Name: Alexandra Rios MRN: [id number] Date of Birth: Jan 17, 1956 Referring Provider: Zara Council, MD  Encounter Date: 03/14/2016      PT End of Session - 03/14/16 1002    Visit Number (p) 7   Number of Visits (p) 12   Date for PT Re-Evaluation (p) 03/28/16   Authorization Type (p) g-code 7/10   Activity Tolerance (p) Patient tolerated treatment well;No increased pain   Behavior During Therapy (p) [facility] for tasks assessed/performed      Past Medical History  Diagnosis Date  . Arthritis     viral  . Heart burn   . Recurrent UTI   . Heart murmur     Past Surgical History  Procedure Laterality Date  . Cholecystectomy    . Breast biopsy  Left 1987  . Dilation and curettage of uterus  2005    There were no vitals filed for this visit.      Subjective Assessment - 03/14/16 0904    Subjective Pt reported she has been incorporating squat when gardening and picking beans. She notices increased strength in her legs. Pt has been performing kegels at 20 secholds, for 10-15 reps in one period of time.             [facility] PT Assessment - 03/14/16 1000    Lunges   Comments unable to dissassociate between trunk and pelvis, decreased weightbearing in fore foot in simulated fitness routine exercises   demo'd poor alignment and increased upper trap mm tensions                     [facility] Adult PT Treatment/Exercise - 03/15/16 1448    Therapeutic Activites    Therapeutic Activities --  modifications to fitness exercsies to promote balance and LE   Neuro Re-ed    Neuro Re-ed Details  explained progression of pelvic floor exercises, alignment and principles of exercise with deep core and self selected fitness exercises                 PT  Education - 03/14/16 1002    Education provided Yes   Education Details HEP, d/c   Person(s) Educated Patient   Methods Explanation;Demonstration;Tactile cues;Handout;Verbal cues   Comprehension Returned demonstration;Verbalized understanding             PT Long Term Goals - 03/14/16 0917    PT LONG TERM GOAL #1   Title Pt will report she will be able to completely empty her urine > 75% of the time in order to improve her ADLs.    Time 12   Period Weeks   Status Achieved   PT LONG TERM GOAL #2   Title Pt will decrease her PFDI score from 26% to < 16% in order to demo improve pelvic floor function   Time 12   Period Weeks   Status Achieved   PT LONG TERM GOAL #3   Title Pt will demo proper body mechanics with house hold chores and exercises without cuing in order to minimize worsening of prolpase Sx.   Time 12   Period Weeks   Status Achieved   PT LONG TERM GOAL #4   Title Pt will be able to demo modifications to self-selected fitness routine with proper alignment, form, technique without  lumbopelvic perturbations and increased deep core coordination in order to improve pelvic function and decrease risk for injuries.   Time 12   Period Weeks   Status Achieved               Plan - March 20, 2016 0952    Clinical Impression Statement Pt reported she has improved "A Great Deal Better"   based on the GROC scale. Pt has met 100% of her goals with clear understanding and correct demonstration of pelvic floor coordination/ strengthening exercises , deep core mm activation with functional tasks to minimize downward forces on her pelvic floor, and modifications of her self-selected fitness routine to optimize pelvic floor and LE mm activation for balance and postural stability. Pt is ready to be d/c. Thank you for your referral!    Rehab Potential Good   PT Frequency 1x / week   PT Duration 12 weeks   PT Treatment/Interventions ADLs/Self Care Home Management;Aquatic Therapy;Gait  training;Stair training;Functional mobility training;Moist Heat;Therapeutic activities;Therapeutic exercise;Balance training;Neuromuscular re-education;Manual techniques;Patient/family education;Scar mobilization   Consulted and Agree with Plan of Care Patient      Patient will benefit from skilled therapeutic intervention in order to improve the following deficits and impairments:  Abnormal gait, Decreased balance, Decreased safety awareness, Decreased strength, Decreased endurance, Decreased activity tolerance, Decreased coordination, Decreased mobility, Postural dysfunction, Improper body mechanics  Visit Diagnosis: Abnormal posture  Unspecified lack of coordination       G-Codes - 20-Mar-2016 1446    Functional Assessment Tool Used clinical judgement and PFDI   Functional Limitation Self care   Self Care Current Status (O1188) At least 20 percent but less than 40 percent impaired, limited or restricted   Self Care Goal Status (Q7737) At least 1 percent but less than 20 percent impaired, limited or restricted   Self Care Discharge Status [phone]) 0 percent impaired, limited or restricted      Problem List Patient Active Problem List   Diagnosis Date Noted  . Recurrent UTI 12/03/2015  . Microscopic hematuria 12/03/2015  . Cystocele, grade 2 12/03/2015  . Elevated serum creatinine 12/03/2015  . Left breast mass 11/28/2015  . Absolute anemia 08/30/2015  . BP (high blood pressure) 08/30/2015  . Headache, migraine 08/30/2015  . Adiposity 08/30/2015  . Frequent UTI 08/30/2015    Jerl Mina ,PT, DPT, E-RYT  03/15/2016, 2:49 PM  [place] MAIN [facility] SERVICES [address] [place], [place], [address] Phone: [phone]   Fax:  [phone]  Name: Alexandra Rios MRN: [id number] Date of Birth: 1955-12-11

## 2016-03-28 ENCOUNTER — Encounter: Payer: 59 | Admitting: Physical Therapy

## 2016-03-30 ENCOUNTER — Encounter: Payer: 59 | Admitting: Physical Therapy

## 2016-11-18 DIAGNOSIS — J019 Acute sinusitis, unspecified: Secondary | ICD-10-CM | POA: Diagnosis not present

## 2016-11-26 ENCOUNTER — Other Ambulatory Visit: Payer: Self-pay | Admitting: General Surgery

## 2016-11-26 DIAGNOSIS — Z1231 Encounter for screening mammogram for malignant neoplasm of breast: Secondary | ICD-10-CM

## 2016-12-07 DIAGNOSIS — J0191 Acute recurrent sinusitis, unspecified: Secondary | ICD-10-CM | POA: Diagnosis not present

## 2016-12-07 DIAGNOSIS — R399 Unspecified symptoms and signs involving the genitourinary system: Secondary | ICD-10-CM | POA: Diagnosis not present

## 2016-12-24 ENCOUNTER — Ambulatory Visit: Payer: 59

## 2016-12-27 ENCOUNTER — Ambulatory Visit: Payer: 59

## 2017-01-02 DIAGNOSIS — I10 Essential (primary) hypertension: Secondary | ICD-10-CM | POA: Diagnosis not present

## 2017-01-02 DIAGNOSIS — R300 Dysuria: Secondary | ICD-10-CM | POA: Diagnosis not present

## 2017-01-03 ENCOUNTER — Ambulatory Visit: Payer: 59 | Admitting: Certified Nurse Midwife

## 2017-01-11 ENCOUNTER — Ambulatory Visit
Admission: RE | Admit: 2017-01-11 | Discharge: 2017-01-11 | Disposition: A | Discharge status: Home / Self Care | Payer: 59 | Source: Ambulatory Visit | Attending: General Surgery | Admitting: General Surgery

## 2017-01-11 DIAGNOSIS — Z1231 Encounter for screening mammogram for malignant neoplasm of breast: Secondary | ICD-10-CM | POA: Diagnosis not present

## 2017-01-25 ENCOUNTER — Ambulatory Visit: Payer: 59 | Admitting: Obstetrics & Gynecology

## 2017-02-06 ENCOUNTER — Encounter: Payer: Self-pay | Admitting: Obstetrics & Gynecology

## 2017-02-06 ENCOUNTER — Ambulatory Visit (INDEPENDENT_AMBULATORY_CARE_PROVIDER_SITE_OTHER): Payer: 59 | Admitting: Obstetrics & Gynecology

## 2017-02-06 VITALS — BP 130/80 | HR 68 | Ht 64.0 in | Wt 257.0 lb

## 2017-02-06 DIAGNOSIS — Z01419 Encounter for gynecological examination (general) (routine) without abnormal findings: Secondary | ICD-10-CM

## 2017-02-06 DIAGNOSIS — E669 Obesity, unspecified: Secondary | ICD-10-CM | POA: Diagnosis not present

## 2017-02-06 DIAGNOSIS — Z0000 Encounter for general adult medical examination without abnormal findings: Secondary | ICD-10-CM

## 2017-02-06 DIAGNOSIS — Z1211 Encounter for screening for malignant neoplasm of colon: Secondary | ICD-10-CM | POA: Diagnosis not present

## 2017-02-06 NOTE — Progress Notes (Signed)
HPI:      [name] is a 61 y.o. [id number] who LMP was in the past, she presents today for her annual examination.  The patient has no complaints today. The patient is sexually active. Herlast pap: approximate date 2016 and was normal and last mammogram: approximate date 2018, May and was normal. LAST YEAR LEFT BREAST BIOPSY BENIGN. The patient does perform self breast exams.  There is no notable family history of breast or ovarian cancer in her family. The patient is not taking hormone replacement therapy. Patient denies post-menopausal vaginal bleeding.   The patient has regular exercise: yes. The patient denies current symptoms of depression.  Occas urinary sx's.  No LOU.  Does core exercise to prevent atrophy/cystocele (has seen PT for this).  GYN Hx: Last Colonoscopy:3 years ago. Normal.  Last DEXA: never ago.    PMHx: Past Medical History:  Diagnosis Date  . Arthritis    viral  . Diverticulitis   . Fibroids   . Heart burn   . Heart murmur   . IC (interstitial cystitis)   . Recurrent UTI    Past Surgical History:  Procedure Laterality Date  . BREAST BIOPSY Left 11/2015  . BREAST BIOPSY Left 1987  . BREAST BIOPSY    . breast biopsy  Left 1987  . CHOLECYSTECTOMY    . DILATION AND CURETTAGE OF UTERUS  2005   Family History  Problem Relation Age of Onset  . Bladder Cancer Maternal Uncle   . Kidney disease Maternal Uncle    Social History  Substance Use Topics  . Smoking status: Never Smoker  . Smokeless tobacco: Never Used  . Alcohol use No    Current Outpatient Prescriptions:  .  Ascorbic Acid POWD, by Does not apply route. Reported on 11/29/2015, Disp: , Rfl:  .  D-Mannose POWD, Take by mouth., Disp: , Rfl:  .  DIGEST ENZYMES-ANTICHOLINERGIC PO, Take by mouth., Disp: , Rfl:  .  Fish Oil-Krill Oil (KRILL OIL PLUS) CAPS, Take by mouth., Disp: , Rfl:  .  Lactobacillus ([place] PRO-B PO), Take by mouth., Disp: , Rfl:  .  Multiple Vitamins-Minerals (CENTRUM  SILVER PO), Take by mouth., Disp: , Rfl:  .  nitrofurantoin (MACRODANTIN) 100 MG capsule, Take 1 capsule (100 mg total) by mouth 4 (four) times daily., Disp: 40 capsule, Rfl: 0 Allergies: Naproxen; Amoxicillin-pot clavulanate; Cephalosporins; Dairy aid [lactase]; Eggs or egg-derived products; and Septra [sulfamethoxazole-trimethoprim]  Review of Systems  Constitutional: Negative for chills, fever and malaise/fatigue.  HENT: Negative for congestion, sinus pain and sore throat.   Eyes: Negative for blurred vision and pain.  Respiratory: Negative for cough and wheezing.   Cardiovascular: Negative for chest pain and leg swelling.  Gastrointestinal: Negative for abdominal pain, constipation, diarrhea, heartburn, nausea and vomiting.  Genitourinary: Negative for dysuria, frequency, hematuria and urgency.  Musculoskeletal: Negative for back pain, joint pain, myalgias and neck pain.  Skin: Negative for itching and rash.  Neurological: Negative for dizziness, tremors and weakness.  Endo/Heme/Allergies: Does not bruise/bleed easily.  Psychiatric/Behavioral: Negative for depression. The patient is not nervous/anxious and does not have insomnia.     Objective: BP 130/80   Pulse 68   Ht 5\' 4"  (1.626 m)   Wt 257 lb (116.6 kg)   BMI 44.11 kg/m   Filed Weights   02/06/17 0907  Weight: 257 lb (116.6 kg)   Body mass index is 44.11 kg/m. Physical Exam  Constitutional: She is oriented to person, place, and time.  She appears well-developed and well-nourished. No distress.  Genitourinary: Rectum normal, vagina normal and uterus normal. Pelvic exam was performed with patient supine. There is no rash or lesion on the right labia. There is no rash or lesion on the left labia. Vagina exhibits no lesion. No bleeding in the vagina. Right adnexum does not display mass and does not display tenderness. Left adnexum does not display mass and does not display tenderness. Cervix does not exhibit motion tenderness,  lesion, friability or polyp.   Uterus is mobile and midaxial. Uterus is not enlarged or exhibiting a mass.  HENT:  Head: Normocephalic and atraumatic. Head is without laceration.  Right Ear: Hearing normal.  Left Ear: Hearing normal.  Nose: No epistaxis.  No foreign bodies.  Mouth/Throat: Uvula is midline, oropharynx is clear and moist and mucous membranes are normal.  Eyes: Pupils are equal, round, and reactive to light.  Neck: Normal range of motion. Neck supple. No thyromegaly present.  Cardiovascular: Normal rate and regular rhythm.  Exam reveals no gallop and no friction rub.   No murmur heard. Pulmonary/Chest: Effort normal and breath sounds normal. No respiratory distress. She has no wheezes. Right breast exhibits no mass, no skin change and no tenderness. Left breast exhibits no mass, no skin change and no tenderness.  Abdominal: Soft. Bowel sounds are normal. She exhibits no distension. There is no tenderness. There is no rebound.  Musculoskeletal: Normal range of motion.  Neurological: She is alert and oriented to person, place, and time. No cranial nerve deficit.  Skin: Skin is warm and dry.  Psychiatric: She has a normal mood and affect. Judgment normal.  Vitals reviewed.   Assessment: Annual Exam 1. Annual physical exam   2. Obesity (BMI 30-39.9)   3. Screen for colon cancer     Plan:            1.  Cervical Screening-  Pap smear schedule reviewed with patient, due 2019  2. Breast screening- Exam annually and mammogram scheduled  3. Colonoscopy every 10 years, Hemoccult testing after age 4  4. Labs managed by PCP  5. Counseling for hormonal therapy: none     F/U  Return in about 1 year (around 02/06/2018) for Annual.  Barnett Applebaum, MD, [facility], [facility] 02/06/2017  9:37 AM

## 2017-02-06 NOTE — Patient Instructions (Signed)
PAP every three years Mammogram every year Colonoscopy every 10 years Labs yearly (with PCP)   

## 2017-02-11 DIAGNOSIS — Z1211 Encounter for screening for malignant neoplasm of colon: Secondary | ICD-10-CM | POA: Diagnosis not present

## 2017-02-13 LAB — FECAL OCCULT BLOOD, IMMUNOCHEMICAL: Fecal Occult Bld: NEGATIVE

## 2017-05-07 DIAGNOSIS — Z79899 Other long term (current) drug therapy: Secondary | ICD-10-CM | POA: Diagnosis not present

## 2017-05-14 DIAGNOSIS — M1990 Unspecified osteoarthritis, unspecified site: Secondary | ICD-10-CM | POA: Insufficient documentation

## 2017-05-14 DIAGNOSIS — Z0000 Encounter for general adult medical examination without abnormal findings: Secondary | ICD-10-CM | POA: Diagnosis not present

## 2017-05-14 DIAGNOSIS — I10 Essential (primary) hypertension: Secondary | ICD-10-CM | POA: Diagnosis not present

## 2017-07-16 ENCOUNTER — Other Ambulatory Visit: Payer: Self-pay | Admitting: Physician Assistant

## 2017-07-16 DIAGNOSIS — S83262A Peripheral tear of lateral meniscus, current injury, left knee, initial encounter: Secondary | ICD-10-CM | POA: Diagnosis not present

## 2017-07-16 DIAGNOSIS — M170 Bilateral primary osteoarthritis of knee: Secondary | ICD-10-CM | POA: Diagnosis not present

## 2017-07-16 DIAGNOSIS — M25561 Pain in right knee: Secondary | ICD-10-CM | POA: Diagnosis not present

## 2017-07-19 ENCOUNTER — Ambulatory Visit
Admission: RE | Admit: 2017-07-19 | Discharge: 2017-07-19 | Disposition: A | Discharge status: Home / Self Care | Payer: 59 | Source: Ambulatory Visit | Attending: Physician Assistant | Admitting: Physician Assistant

## 2017-07-19 DIAGNOSIS — X58XXXA Exposure to other specified factors, initial encounter: Secondary | ICD-10-CM | POA: Insufficient documentation

## 2017-07-19 DIAGNOSIS — S83262A Peripheral tear of lateral meniscus, current injury, left knee, initial encounter: Secondary | ICD-10-CM | POA: Diagnosis present

## 2017-07-19 DIAGNOSIS — S83242A Other tear of medial meniscus, current injury, left knee, initial encounter: Secondary | ICD-10-CM | POA: Diagnosis not present

## 2017-07-19 DIAGNOSIS — M179 Osteoarthritis of knee, unspecified: Secondary | ICD-10-CM | POA: Diagnosis not present

## 2017-07-22 DIAGNOSIS — M170 Bilateral primary osteoarthritis of knee: Secondary | ICD-10-CM | POA: Diagnosis not present

## 2017-07-22 DIAGNOSIS — S83262D Peripheral tear of lateral meniscus, current injury, left knee, subsequent encounter: Secondary | ICD-10-CM | POA: Diagnosis not present

## 2017-07-24 ENCOUNTER — Ambulatory Visit: Payer: 59

## 2017-08-22 DIAGNOSIS — R51 Headache: Secondary | ICD-10-CM | POA: Diagnosis not present

## 2018-04-22 DIAGNOSIS — M9903 Segmental and somatic dysfunction of lumbar region: Secondary | ICD-10-CM | POA: Diagnosis not present

## 2018-04-22 DIAGNOSIS — M9905 Segmental and somatic dysfunction of pelvic region: Secondary | ICD-10-CM | POA: Diagnosis not present

## 2018-04-22 DIAGNOSIS — M9906 Segmental and somatic dysfunction of lower extremity: Secondary | ICD-10-CM | POA: Diagnosis not present

## 2018-04-25 DIAGNOSIS — M9906 Segmental and somatic dysfunction of lower extremity: Secondary | ICD-10-CM | POA: Diagnosis not present

## 2018-04-25 DIAGNOSIS — M9903 Segmental and somatic dysfunction of lumbar region: Secondary | ICD-10-CM | POA: Diagnosis not present

## 2018-04-25 DIAGNOSIS — M9905 Segmental and somatic dysfunction of pelvic region: Secondary | ICD-10-CM | POA: Diagnosis not present

## 2018-04-28 ENCOUNTER — Other Ambulatory Visit: Payer: Self-pay | Admitting: Obstetrics & Gynecology

## 2018-04-28 DIAGNOSIS — Z1231 Encounter for screening mammogram for malignant neoplasm of breast: Secondary | ICD-10-CM

## 2018-04-29 DIAGNOSIS — M9906 Segmental and somatic dysfunction of lower extremity: Secondary | ICD-10-CM | POA: Diagnosis not present

## 2018-04-29 DIAGNOSIS — M9905 Segmental and somatic dysfunction of pelvic region: Secondary | ICD-10-CM | POA: Diagnosis not present

## 2018-04-29 DIAGNOSIS — M9903 Segmental and somatic dysfunction of lumbar region: Secondary | ICD-10-CM | POA: Diagnosis not present

## 2018-05-06 ENCOUNTER — Ambulatory Visit
Admission: RE | Admit: 2018-05-06 | Discharge: 2018-05-06 | Disposition: A | Discharge status: Home / Self Care | Payer: 59 | Source: Ambulatory Visit | Attending: Obstetrics & Gynecology | Admitting: Obstetrics & Gynecology

## 2018-05-06 DIAGNOSIS — Z1231 Encounter for screening mammogram for malignant neoplasm of breast: Secondary | ICD-10-CM | POA: Diagnosis not present

## 2018-05-07 ENCOUNTER — Other Ambulatory Visit: Payer: Self-pay | Admitting: Physician Assistant

## 2018-05-07 ENCOUNTER — Ambulatory Visit
Admission: RE | Admit: 2018-05-07 | Discharge: 2018-05-07 | Disposition: A | Discharge status: Home / Self Care | Payer: 59 | Source: Ambulatory Visit | Attending: Physician Assistant | Admitting: Physician Assistant

## 2018-05-07 DIAGNOSIS — M79604 Pain in right leg: Secondary | ICD-10-CM | POA: Insufficient documentation

## 2018-05-13 ENCOUNTER — Encounter: Payer: Self-pay | Admitting: Obstetrics & Gynecology

## 2018-05-13 ENCOUNTER — Ambulatory Visit (INDEPENDENT_AMBULATORY_CARE_PROVIDER_SITE_OTHER): Payer: 59 | Admitting: Obstetrics & Gynecology

## 2018-05-13 ENCOUNTER — Other Ambulatory Visit (HOSPITAL_COMMUNITY)
Admission: RE | Admit: 2018-05-13 | Discharge: 2018-05-13 | Disposition: A | Discharge status: Home / Self Care | Payer: 59 | Source: Ambulatory Visit | Attending: Obstetrics & Gynecology | Admitting: Obstetrics & Gynecology

## 2018-05-13 VITALS — BP 130/80 | Ht 65.0 in | Wt 265.0 lb

## 2018-05-13 DIAGNOSIS — Z124 Encounter for screening for malignant neoplasm of cervix: Secondary | ICD-10-CM | POA: Diagnosis not present

## 2018-05-13 DIAGNOSIS — Z0000 Encounter for general adult medical examination without abnormal findings: Secondary | ICD-10-CM

## 2018-05-13 DIAGNOSIS — Z1151 Encounter for screening for human papillomavirus (HPV): Secondary | ICD-10-CM | POA: Insufficient documentation

## 2018-05-13 DIAGNOSIS — Z1211 Encounter for screening for malignant neoplasm of colon: Secondary | ICD-10-CM | POA: Diagnosis not present

## 2018-05-13 DIAGNOSIS — Z01419 Encounter for gynecological examination (general) (routine) without abnormal findings: Secondary | ICD-10-CM

## 2018-05-13 NOTE — Patient Instructions (Addendum)
PAP every three years Mammogram every year Colonoscopy every 10 years    Stool testing yearly Labs yearly (with PCP)    Kegel exercises regularly

## 2018-05-13 NOTE — Progress Notes (Signed)
HPI:      [name] is a 62 y.o. [id number] who LMP was in the past, she presents today for her annual examination.  The patient has no complaints today. The patient is sexually active. Herlast pap: approximate date 2016 and was normal and last mammogram: approximate date 2019 and was normal.  The patient does perform self breast exams.  There is no notable family history of breast or ovarian cancer in her family. The patient is not taking hormone replacement therapy. Patient denies post-menopausal vaginal bleeding.   The patient has regular exercise: yes. The patient denies current symptoms of depression.    GYN Hx: Last Colonoscopy:4 years ago. Normal.   PMHx: Past Medical History:  Diagnosis Date  . Arthritis    viral  . Diverticulitis   . Fibroids   . Heart burn   . Heart murmur   . IC (interstitial cystitis)   . Recurrent UTI    Past Surgical History:  Procedure Laterality Date  . BREAST BIOPSY Left 11/2015  . breast biopsy  Left 1987  . BREAST EXCISIONAL BIOPSY Left 1987  . CHOLECYSTECTOMY    . DILATION AND CURETTAGE OF UTERUS  2005   Family History  Problem Relation Age of Onset  . Bladder Cancer Maternal Uncle   . Kidney disease Maternal Uncle   . Breast cancer Neg Hx    Social History   Tobacco Use  . Smoking status: Never Smoker  . Smokeless tobacco: Never Used  Substance Use Topics  . Alcohol use: No    Alcohol/week: 0.0 standard drinks  . Drug use: No    Current Outpatient Medications:  .  Ascorbic Acid POWD, by Does not apply route. Reported on 11/29/2015, Disp: , Rfl:  .  D-Mannose POWD, Take by mouth., Disp: , Rfl:  .  DIGEST ENZYMES-ANTICHOLINERGIC PO, Take by mouth., Disp: , Rfl:  .  Fish Oil-Krill Oil (KRILL OIL PLUS) CAPS, Take by mouth., Disp: , Rfl:  .  Lactobacillus ([place] PRO-B PO), Take by mouth., Disp: , Rfl:  .  Multiple Vitamins-Minerals (CENTRUM SILVER PO), Take by mouth., Disp: , Rfl:  .  nitrofurantoin (MACRODANTIN) 100 MG  capsule, Take 1 capsule (100 mg total) by mouth 4 (four) times daily., Disp: 40 capsule, Rfl: 0 Allergies: Naproxen; Amoxicillin-pot clavulanate; Cephalosporins; Dairy aid [lactase]; Eggs or egg-derived products; and Septra [sulfamethoxazole-trimethoprim]  Review of Systems  Constitutional: Negative for chills, fever and malaise/fatigue.  HENT: Negative for congestion, sinus pain and sore throat.   Eyes: Negative for blurred vision and pain.  Respiratory: Negative for cough and wheezing.   Cardiovascular: Negative for chest pain and leg swelling.  Gastrointestinal: Negative for abdominal pain, constipation, diarrhea, heartburn, nausea and vomiting.  Genitourinary: Negative for dysuria, frequency, hematuria and urgency.  Musculoskeletal: Negative for back pain, joint pain, myalgias and neck pain.  Skin: Negative for itching and rash.  Neurological: Negative for dizziness, tremors and weakness.  Endo/Heme/Allergies: Does not bruise/bleed easily.  Psychiatric/Behavioral: Negative for depression. The patient is not nervous/anxious and does not have insomnia.     Objective: BP 130/80   Ht 5\' 5"  (1.651 m)   Wt 265 lb (120.2 kg)   BMI 44.10 kg/m   Filed Weights   05/13/18 1331  Weight: 265 lb (120.2 kg)   Body mass index is 44.1 kg/m. Physical Exam  Constitutional: She is oriented to person, place, and time. She appears well-developed and well-nourished. No distress.  Genitourinary: Rectum normal, vagina normal and uterus  normal. Pelvic exam was performed with patient supine. There is no rash or lesion on the right labia. There is no rash or lesion on the left labia. Vagina exhibits no lesion. No bleeding in the vagina. Right adnexum does not display mass and does not display tenderness. Left adnexum does not display mass and does not display tenderness. Cervix does not exhibit motion tenderness, lesion, friability or polyp.   Uterus is mobile and midaxial. Uterus is not enlarged or  exhibiting a mass.  HENT:  Head: Normocephalic and atraumatic. Head is without laceration.  Right Ear: Hearing normal.  Left Ear: Hearing normal.  Nose: No epistaxis.  No foreign bodies.  Mouth/Throat: Uvula is midline, oropharynx is clear and moist and mucous membranes are normal.  Eyes: Pupils are equal, round, and reactive to light.  Neck: Normal range of motion. Neck supple. No thyromegaly present.  Cardiovascular: Normal rate and regular rhythm. Exam reveals no gallop and no friction rub.  No murmur heard. Pulmonary/Chest: Effort normal and breath sounds normal. No respiratory distress. She has no wheezes. Right breast exhibits no mass, no skin change and no tenderness. Left breast exhibits no mass, no skin change and no tenderness.  Abdominal: Soft. Bowel sounds are normal. She exhibits no distension. There is no tenderness. There is no rebound.  Musculoskeletal: Normal range of motion.  Neurological: She is alert and oriented to person, place, and time. No cranial nerve deficit.  Skin: Skin is warm and dry.  Psychiatric: She has a normal mood and affect. Judgment normal.  Vitals reviewed.  Assessment: Annual Exam 1. Annual physical exam   2. Screening for cervical cancer   3. Screen for colon cancer    Plan:            1.  Cervical Screening-  Pap smear done today  2. Breast screening- Exam annually and mammogram scheduled  3. Colonoscopy every 10 years, Hemoccult testing after age 47  4. Labs managed by PCP   5. Counseling for hormonal therapy: none  6. Flu shot declined     F/U  Return in about 1 year (around 05/14/2019) for Annual.  Barnett Applebaum, MD, [facility], [facility] 05/13/2018  1:34 PM

## 2018-05-14 ENCOUNTER — Encounter

## 2018-05-14 ENCOUNTER — Encounter (INDEPENDENT_AMBULATORY_CARE_PROVIDER_SITE_OTHER): Payer: 59 | Admitting: Vascular Surgery

## 2018-05-15 LAB — CYTOLOGY - PAP
Diagnosis: NEGATIVE
HPV (WINDOPATH): NOT DETECTED

## 2018-05-16 DIAGNOSIS — Z1211 Encounter for screening for malignant neoplasm of colon: Secondary | ICD-10-CM | POA: Diagnosis not present

## 2018-05-19 DIAGNOSIS — M25561 Pain in right knee: Secondary | ICD-10-CM | POA: Diagnosis not present

## 2018-05-19 DIAGNOSIS — M23201 Derangement of unspecified lateral meniscus due to old tear or injury, left knee: Secondary | ICD-10-CM | POA: Diagnosis not present

## 2018-05-19 DIAGNOSIS — M170 Bilateral primary osteoarthritis of knee: Secondary | ICD-10-CM | POA: Diagnosis not present

## 2018-05-21 LAB — FECAL OCCULT BLOOD, IMMUNOCHEMICAL: Fecal Occult Bld: NEGATIVE

## 2018-05-26 DIAGNOSIS — M5431 Sciatica, right side: Secondary | ICD-10-CM | POA: Diagnosis not present

## 2018-05-26 DIAGNOSIS — M9902 Segmental and somatic dysfunction of thoracic region: Secondary | ICD-10-CM | POA: Diagnosis not present

## 2018-05-26 DIAGNOSIS — M9903 Segmental and somatic dysfunction of lumbar region: Secondary | ICD-10-CM | POA: Diagnosis not present

## 2018-05-27 DIAGNOSIS — M9902 Segmental and somatic dysfunction of thoracic region: Secondary | ICD-10-CM | POA: Diagnosis not present

## 2018-05-27 DIAGNOSIS — M9903 Segmental and somatic dysfunction of lumbar region: Secondary | ICD-10-CM | POA: Diagnosis not present

## 2018-05-27 DIAGNOSIS — M5431 Sciatica, right side: Secondary | ICD-10-CM | POA: Diagnosis not present

## 2018-05-28 DIAGNOSIS — M5431 Sciatica, right side: Secondary | ICD-10-CM | POA: Diagnosis not present

## 2018-05-28 DIAGNOSIS — M9903 Segmental and somatic dysfunction of lumbar region: Secondary | ICD-10-CM | POA: Diagnosis not present

## 2018-05-28 DIAGNOSIS — M9902 Segmental and somatic dysfunction of thoracic region: Secondary | ICD-10-CM | POA: Diagnosis not present

## 2018-05-30 DIAGNOSIS — M9903 Segmental and somatic dysfunction of lumbar region: Secondary | ICD-10-CM | POA: Diagnosis not present

## 2018-05-30 DIAGNOSIS — M5431 Sciatica, right side: Secondary | ICD-10-CM | POA: Diagnosis not present

## 2018-05-30 DIAGNOSIS — M9902 Segmental and somatic dysfunction of thoracic region: Secondary | ICD-10-CM | POA: Diagnosis not present

## 2018-06-02 DIAGNOSIS — M9902 Segmental and somatic dysfunction of thoracic region: Secondary | ICD-10-CM | POA: Diagnosis not present

## 2018-06-02 DIAGNOSIS — M9903 Segmental and somatic dysfunction of lumbar region: Secondary | ICD-10-CM | POA: Diagnosis not present

## 2018-06-02 DIAGNOSIS — M5431 Sciatica, right side: Secondary | ICD-10-CM | POA: Diagnosis not present

## 2018-06-02 DIAGNOSIS — M545 Low back pain: Secondary | ICD-10-CM | POA: Diagnosis not present

## 2018-06-04 DIAGNOSIS — M545 Low back pain: Secondary | ICD-10-CM | POA: Diagnosis not present

## 2018-06-09 DIAGNOSIS — M545 Low back pain: Secondary | ICD-10-CM | POA: Diagnosis not present

## 2018-06-11 DIAGNOSIS — Z79899 Other long term (current) drug therapy: Secondary | ICD-10-CM | POA: Diagnosis not present

## 2018-06-13 DIAGNOSIS — M545 Low back pain: Secondary | ICD-10-CM | POA: Diagnosis not present

## 2018-06-16 ENCOUNTER — Other Ambulatory Visit (HOSPITAL_COMMUNITY): Payer: Self-pay | Admitting: Physician Assistant

## 2018-06-16 ENCOUNTER — Other Ambulatory Visit: Payer: Self-pay | Admitting: Physician Assistant

## 2018-06-16 DIAGNOSIS — M5441 Lumbago with sciatica, right side: Secondary | ICD-10-CM | POA: Diagnosis not present

## 2018-06-18 DIAGNOSIS — I10 Essential (primary) hypertension: Secondary | ICD-10-CM | POA: Diagnosis not present

## 2018-06-18 DIAGNOSIS — D721 Eosinophilia: Secondary | ICD-10-CM | POA: Diagnosis not present

## 2018-06-18 DIAGNOSIS — Z0000 Encounter for general adult medical examination without abnormal findings: Secondary | ICD-10-CM | POA: Diagnosis not present

## 2018-06-19 ENCOUNTER — Ambulatory Visit (HOSPITAL_COMMUNITY)
Admission: RE | Admit: 2018-06-19 | Discharge: 2018-06-19 | Disposition: A | Discharge status: Home / Self Care | Payer: 59 | Source: Ambulatory Visit | Attending: Physician Assistant | Admitting: Physician Assistant

## 2018-06-19 DIAGNOSIS — M4726 Other spondylosis with radiculopathy, lumbar region: Secondary | ICD-10-CM | POA: Diagnosis not present

## 2018-06-19 DIAGNOSIS — M47816 Spondylosis without myelopathy or radiculopathy, lumbar region: Secondary | ICD-10-CM | POA: Diagnosis not present

## 2018-06-19 DIAGNOSIS — M5126 Other intervertebral disc displacement, lumbar region: Secondary | ICD-10-CM | POA: Diagnosis not present

## 2018-06-19 DIAGNOSIS — M5441 Lumbago with sciatica, right side: Secondary | ICD-10-CM | POA: Diagnosis not present

## 2018-06-24 DIAGNOSIS — M545 Low back pain: Secondary | ICD-10-CM | POA: Diagnosis not present

## 2018-06-27 DIAGNOSIS — M5430 Sciatica, unspecified side: Secondary | ICD-10-CM | POA: Diagnosis not present

## 2018-06-27 DIAGNOSIS — M545 Low back pain: Secondary | ICD-10-CM | POA: Diagnosis not present

## 2018-06-27 DIAGNOSIS — M549 Dorsalgia, unspecified: Secondary | ICD-10-CM | POA: Diagnosis not present

## 2018-06-27 DIAGNOSIS — R1013 Epigastric pain: Secondary | ICD-10-CM | POA: Diagnosis not present

## 2018-06-30 ENCOUNTER — Ambulatory Visit: Payer: 59

## 2018-07-01 DIAGNOSIS — R8290 Unspecified abnormal findings in urine: Secondary | ICD-10-CM | POA: Diagnosis not present

## 2018-07-01 DIAGNOSIS — M545 Low back pain: Secondary | ICD-10-CM | POA: Diagnosis not present

## 2018-07-01 DIAGNOSIS — R300 Dysuria: Secondary | ICD-10-CM | POA: Diagnosis not present

## 2018-07-04 DIAGNOSIS — M545 Low back pain: Secondary | ICD-10-CM | POA: Diagnosis not present

## 2018-07-07 DIAGNOSIS — M545 Low back pain: Secondary | ICD-10-CM | POA: Diagnosis not present

## 2018-07-09 DIAGNOSIS — M545 Low back pain: Secondary | ICD-10-CM | POA: Diagnosis not present

## 2018-07-15 DIAGNOSIS — M545 Low back pain: Secondary | ICD-10-CM | POA: Diagnosis not present

## 2018-07-18 DIAGNOSIS — M545 Low back pain: Secondary | ICD-10-CM | POA: Diagnosis not present

## 2018-07-21 DIAGNOSIS — M545 Low back pain: Secondary | ICD-10-CM | POA: Diagnosis not present

## 2018-07-24 DIAGNOSIS — M545 Low back pain: Secondary | ICD-10-CM | POA: Diagnosis not present

## 2018-07-28 DIAGNOSIS — M545 Low back pain: Secondary | ICD-10-CM | POA: Diagnosis not present

## 2018-08-06 DIAGNOSIS — M545 Low back pain: Secondary | ICD-10-CM | POA: Diagnosis not present

## 2018-08-12 DIAGNOSIS — M545 Low back pain: Secondary | ICD-10-CM | POA: Diagnosis not present

## 2018-08-14 DIAGNOSIS — M79604 Pain in right leg: Secondary | ICD-10-CM | POA: Insufficient documentation

## 2018-08-19 DIAGNOSIS — M5441 Lumbago with sciatica, right side: Secondary | ICD-10-CM | POA: Diagnosis not present

## 2018-08-19 DIAGNOSIS — M545 Low back pain: Secondary | ICD-10-CM | POA: Diagnosis not present

## 2018-08-19 DIAGNOSIS — G8929 Other chronic pain: Secondary | ICD-10-CM | POA: Diagnosis not present

## 2018-08-25 DIAGNOSIS — M545 Low back pain: Secondary | ICD-10-CM | POA: Diagnosis not present

## 2018-08-29 DIAGNOSIS — M9905 Segmental and somatic dysfunction of pelvic region: Secondary | ICD-10-CM | POA: Diagnosis not present

## 2018-08-29 DIAGNOSIS — M9906 Segmental and somatic dysfunction of lower extremity: Secondary | ICD-10-CM | POA: Diagnosis not present

## 2018-08-29 DIAGNOSIS — M9903 Segmental and somatic dysfunction of lumbar region: Secondary | ICD-10-CM | POA: Diagnosis not present

## 2018-09-01 DIAGNOSIS — M9905 Segmental and somatic dysfunction of pelvic region: Secondary | ICD-10-CM | POA: Diagnosis not present

## 2018-09-01 DIAGNOSIS — M5441 Lumbago with sciatica, right side: Secondary | ICD-10-CM | POA: Diagnosis not present

## 2018-09-01 DIAGNOSIS — M9903 Segmental and somatic dysfunction of lumbar region: Secondary | ICD-10-CM | POA: Diagnosis not present

## 2018-09-01 DIAGNOSIS — G8929 Other chronic pain: Secondary | ICD-10-CM | POA: Diagnosis not present

## 2018-09-01 DIAGNOSIS — M9906 Segmental and somatic dysfunction of lower extremity: Secondary | ICD-10-CM | POA: Diagnosis not present

## 2019-08-31 ENCOUNTER — Ambulatory Visit: Payer: 59 | Admitting: Obstetrics & Gynecology

## 2019-09-14 ENCOUNTER — Telehealth: Payer: Self-pay | Admitting: Obstetrics & Gynecology

## 2019-09-14 NOTE — Telephone Encounter (Signed)
Please disregard patient was able to get information for her insurance company

## 2019-09-14 NOTE — Telephone Encounter (Signed)
Patient is inquiring about the diagnosis code for vitamin D Lab. Would you please advise .

## 2019-09-28 ENCOUNTER — Other Ambulatory Visit: Payer: Self-pay

## 2019-09-28 ENCOUNTER — Encounter: Payer: Self-pay | Admitting: Obstetrics & Gynecology

## 2019-09-28 ENCOUNTER — Ambulatory Visit (INDEPENDENT_AMBULATORY_CARE_PROVIDER_SITE_OTHER): Payer: BC Managed Care – PPO | Admitting: Obstetrics & Gynecology

## 2019-09-28 VITALS — BP 122/80 | Ht 65.0 in | Wt 267.0 lb

## 2019-09-28 DIAGNOSIS — Z01419 Encounter for gynecological examination (general) (routine) without abnormal findings: Secondary | ICD-10-CM

## 2019-09-28 DIAGNOSIS — Z1211 Encounter for screening for malignant neoplasm of colon: Secondary | ICD-10-CM

## 2019-09-28 DIAGNOSIS — E559 Vitamin D deficiency, unspecified: Secondary | ICD-10-CM

## 2019-09-28 DIAGNOSIS — Z1231 Encounter for screening mammogram for malignant neoplasm of breast: Secondary | ICD-10-CM

## 2019-09-28 NOTE — Patient Instructions (Signed)
PAP every three years    Due 2022 Mammogram every year    Call [phone] to schedule at [facility] Colonoscopy every 10 years    Last done 2015    Stool cards today Labs yearly (with PCP)

## 2019-09-28 NOTE — Progress Notes (Signed)
HPI:      [name] is a 64 y.o. [id number] who LMP was in the past, she presents today for her annual examination.  The patient has no complaints today. The patient is sexually active. Herlast pap: approximate date 2019 and was normal and last mammogram: approximate date 2019 and was normal.  The patient does perform self breast exams.  There is no notable family history of breast or ovarian cancer in her family. New Dx of COLON CANCER w brother this past year.  The patient is not taking hormone replacement therapy. Patient denies post-menopausal vaginal bleeding.   The patient has regular exercise: yes. The patient denies current symptoms of depression.    GYN Hx: Last Colonoscopy:5 years ago. Normal.  Last DEXA: 5 years ago.    PMHx: Past Medical History:  Diagnosis Date  . Arthritis    viral  . Diverticulitis   . Fibroids   . Heart burn   . Heart murmur   . IC (interstitial cystitis)   . Recurrent UTI    Past Surgical History:  Procedure Laterality Date  . BREAST BIOPSY Left 11/2015  . breast biopsy  Left 1987  . BREAST EXCISIONAL BIOPSY Left 1987  . CHOLECYSTECTOMY    . DILATION AND CURETTAGE OF UTERUS  2005   Family History  Problem Relation Age of Onset  . Bladder Cancer Maternal Uncle   . Kidney disease Maternal Uncle   . Colon cancer Brother   . Breast cancer Neg Hx    Social History   Tobacco Use  . Smoking status: Never Smoker  . Smokeless tobacco: Never Used  Substance Use Topics  . Alcohol use: No    Alcohol/week: 0.0 standard drinks  . Drug use: No    Current Outpatient Medications:  .  Ascorbic Acid POWD, by Does not apply route. Reported on 11/29/2015, Disp: , Rfl:  .  D-Mannose POWD, Take by mouth., Disp: , Rfl:  .  DIGEST ENZYMES-ANTICHOLINERGIC PO, Take by mouth., Disp: , Rfl:  .  Fish Oil-Krill Oil (KRILL OIL PLUS) CAPS, Take by mouth., Disp: , Rfl:  .  Lactobacillus ([place] PRO-B PO), Take by mouth., Disp: , Rfl:  .  Multiple  Vitamins-Minerals (CENTRUM SILVER PO), Take by mouth., Disp: , Rfl:  .  nitrofurantoin (MACRODANTIN) 100 MG capsule, Take 1 capsule (100 mg total) by mouth 4 (four) times daily. (Patient not taking: Reported on 09/28/2019), Disp: 40 capsule, Rfl: 0 Allergies: Naproxen, Amoxicillin-pot clavulanate, Cephalosporins, Dairy aid [lactase], Eggs or egg-derived products, and Septra [sulfamethoxazole-trimethoprim]  Review of Systems  Constitutional: Negative for chills, fever and malaise/fatigue.  HENT: Negative for congestion, sinus pain and sore throat.   Eyes: Negative for blurred vision and pain.  Respiratory: Negative for cough and wheezing.   Cardiovascular: Negative for chest pain and leg swelling.  Gastrointestinal: Negative for abdominal pain, constipation, diarrhea, heartburn, nausea and vomiting.  Genitourinary: Negative for dysuria, frequency, hematuria and urgency.  Musculoskeletal: Negative for back pain, joint pain, myalgias and neck pain.  Skin: Negative for itching and rash.  Neurological: Negative for dizziness, tremors and weakness.  Endo/Heme/Allergies: Does not bruise/bleed easily.  Psychiatric/Behavioral: Negative for depression. The patient is not nervous/anxious and does not have insomnia.     Objective: BP 122/80   Ht 5\' 5"  (1.651 m)   Wt 267 lb (121.1 kg)   BMI 44.43 kg/m   Filed Weights   09/28/19 0857  Weight: 267 lb (121.1 kg)   Body mass index  is 44.43 kg/m. Physical Exam Constitutional:      General: She is not in acute distress.    Appearance: She is well-developed. She is obese.  Genitourinary:     Pelvic exam was performed with patient supine.     Vagina, uterus and rectum normal.     No lesions in the vagina.     No vaginal bleeding.     No cervical motion tenderness, friability, lesion or polyp.     Uterus is mobile.     Uterus is not enlarged.     No uterine mass detected.    Uterus is midaxial.     No right or left adnexal mass present.      Right adnexa not tender.     Left adnexa not tender.  HENT:     Head: Normocephalic and atraumatic. No laceration.     Right Ear: Hearing normal.     Left Ear: Hearing normal.     Mouth/Throat:     Pharynx: Uvula midline.  Eyes:     Pupils: Pupils are equal, round, and reactive to light.  Neck:     Thyroid: No thyromegaly.  Cardiovascular:     Rate and Rhythm: Normal rate and regular rhythm.     Heart sounds: No murmur. No friction rub. No gallop.   Pulmonary:     Effort: Pulmonary effort is normal. No respiratory distress.     Breath sounds: Normal breath sounds. No wheezing.  Chest:     Breasts:        Right: No mass, skin change or tenderness.        Left: No mass, skin change or tenderness.  Abdominal:     General: Bowel sounds are normal. There is no distension.     Palpations: Abdomen is soft.     Tenderness: There is no abdominal tenderness. There is no rebound.  Musculoskeletal:        General: Normal range of motion.     Cervical back: Normal range of motion and neck supple.  Neurological:     Mental Status: She is alert and oriented to person, place, and time.     Cranial Nerves: No cranial nerve deficit.  Skin:    General: Skin is warm and dry.  Psychiatric:        Judgment: Judgment normal.  Vitals reviewed.     Assessment: Annual Exam 1. Women's annual routine gynecological examination   2. Encounter for screening mammogram for malignant neoplasm of breast   3. Screen for colon cancer   4. Vitamin D deficiency     Plan:            1.  Cervical Screening-  Pap smear schedule reviewed with patient Due 2022  2. Breast screening- Exam annually and mammogram scheduled, soon  3. Colonoscopy every 10 years, Hemoccult testing after age 34, may adjust interval due to recent change in [place]; will call GI ([facility]) to check)  4. Labs Ordered today  Vit D as has had deficiency in past, currently takes OTC 5000 U daily  5. Counseling for hormonal therapy: none               6. FRAX - FRAX score for assessing the 10 year probability for fracture calculated and discussed today.  Based on age and score today, DEXA is not currently scheduled.  Consider repeating age 77.    F/U  Return in about 1 year (around 09/27/2020) for Annual.  Barnett Applebaum,  MD, [facility], [facility] 09/28/2019  9:29 AM

## 2019-09-29 LAB — VITAMIN D 25 HYDROXY (VIT D DEFICIENCY, FRACTURES): Vit D, 25-Hydroxy: 34.6 ng/mL (ref 30.0–100.0)

## 2019-10-09 LAB — FECAL OCCULT BLOOD, IMMUNOCHEMICAL: Fecal Occult Bld: NEGATIVE

## 2019-12-31 ENCOUNTER — Telehealth: Payer: Self-pay

## 2019-12-31 NOTE — Telephone Encounter (Signed)
-----   Message from Gae Dry, MD sent at 12/22/2019 10:31 AM EDT ----- Regarding: [name] Received notice she has not received [name] yet as ordered at her Annual. Please check and encourage her to do this, and document conversation.

## 2019-12-31 NOTE — Telephone Encounter (Signed)
Pt aware to schedule her mammogram , she had forgot

## 2020-03-27 ENCOUNTER — Other Ambulatory Visit: Payer: Self-pay | Admitting: Obstetrics & Gynecology

## 2020-03-31 ENCOUNTER — Telehealth: Payer: Self-pay

## 2020-03-31 NOTE — Telephone Encounter (Signed)
-----   Message from Gae Dry, MD sent at 03/27/2020 11:17 PM EDT ----- Regarding: [name] Received notice she has Still not received [name] yet as ordered at her Annual 6 months ago. Please check and encourage her to do this, and document conversation.

## 2020-03-31 NOTE — Telephone Encounter (Signed)
Pt is aware to schedule her mammogram

## 2020-06-23 ENCOUNTER — Telehealth: Payer: Self-pay | Admitting: Obstetrics & Gynecology

## 2020-06-23 NOTE — Telephone Encounter (Signed)
Patient is scheduled for 09/29/20 with [facility]

## 2020-06-23 NOTE — Telephone Encounter (Signed)
Patient is aware to contact [facility] for mammogram

## 2020-06-23 NOTE — Telephone Encounter (Signed)
-----   Message from Gae Dry, MD sent at 06/23/2020  9:41 AM EDT ----- Regarding: appt Sch Annual in Feb 2022 and also encourage to have [name] done prior to that time, [facility]

## 2020-07-15 ENCOUNTER — Other Ambulatory Visit: Payer: Self-pay | Admitting: Physician Assistant

## 2020-07-15 DIAGNOSIS — K760 Fatty (change of) liver, not elsewhere classified: Secondary | ICD-10-CM

## 2020-08-02 ENCOUNTER — Other Ambulatory Visit: Payer: Self-pay

## 2020-08-02 ENCOUNTER — Ambulatory Visit
Admission: RE | Admit: 2020-08-02 | Discharge: 2020-08-02 | Disposition: A | Discharge status: Home / Self Care | Payer: Managed Care, Other (non HMO) | Source: Ambulatory Visit | Attending: Physician Assistant | Admitting: Physician Assistant

## 2020-08-02 ENCOUNTER — Other Ambulatory Visit: Payer: 59

## 2020-08-02 DIAGNOSIS — K760 Fatty (change of) liver, not elsewhere classified: Secondary | ICD-10-CM | POA: Diagnosis not present

## 2020-08-02 MED ORDER — GADOBUTROL 1 MMOL/ML IV SOLN
10.0000 mL | Freq: Once | INTRAVENOUS | Status: AC | PRN
  Administered 2020-08-02: 10 mL via INTRAVENOUS

## 2020-08-08 IMAGING — MG MM DIGITAL SCREENING BILAT W/ TOMO W/ CAD
6 of 10 series · 6 of 30 positions shown · non-contrast
Comparison: Previous exam(s).

CLINICAL DATA: Screening.

EXAM:
DIGITAL SCREENING BILATERAL MAMMOGRAM WITH TOMO AND CAD

[R MLO synth-2D]
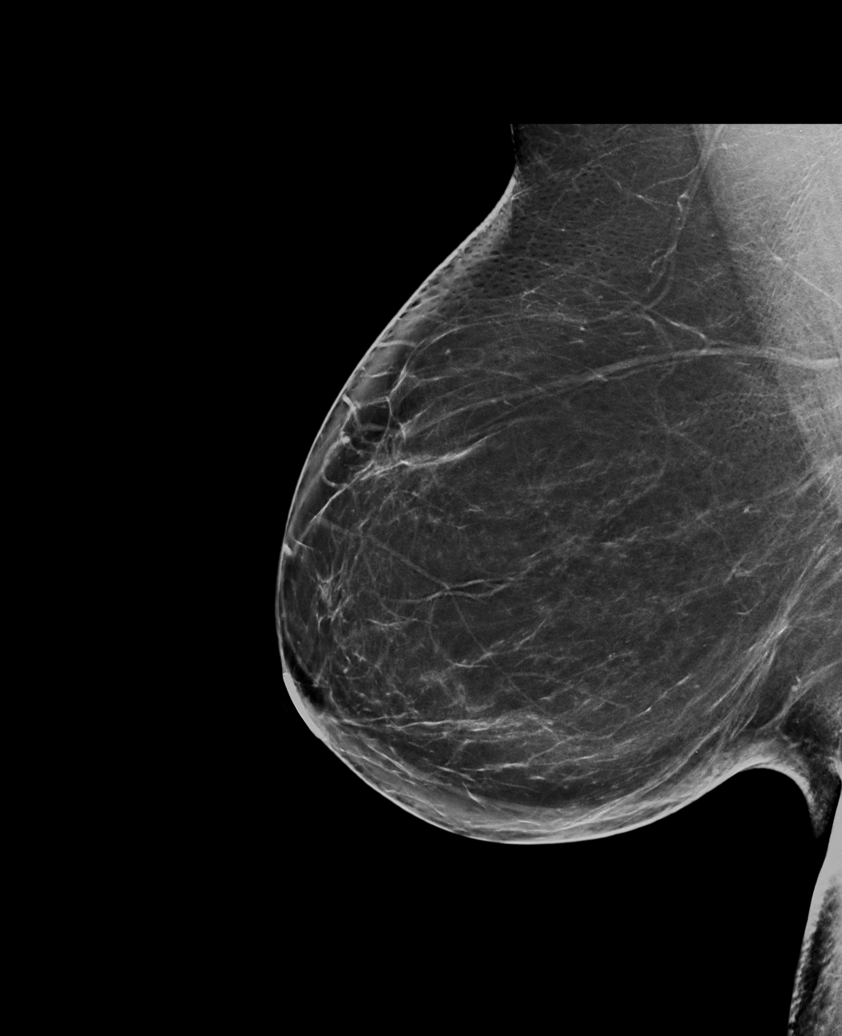

[L MLO synth-2D (1 of 2)]
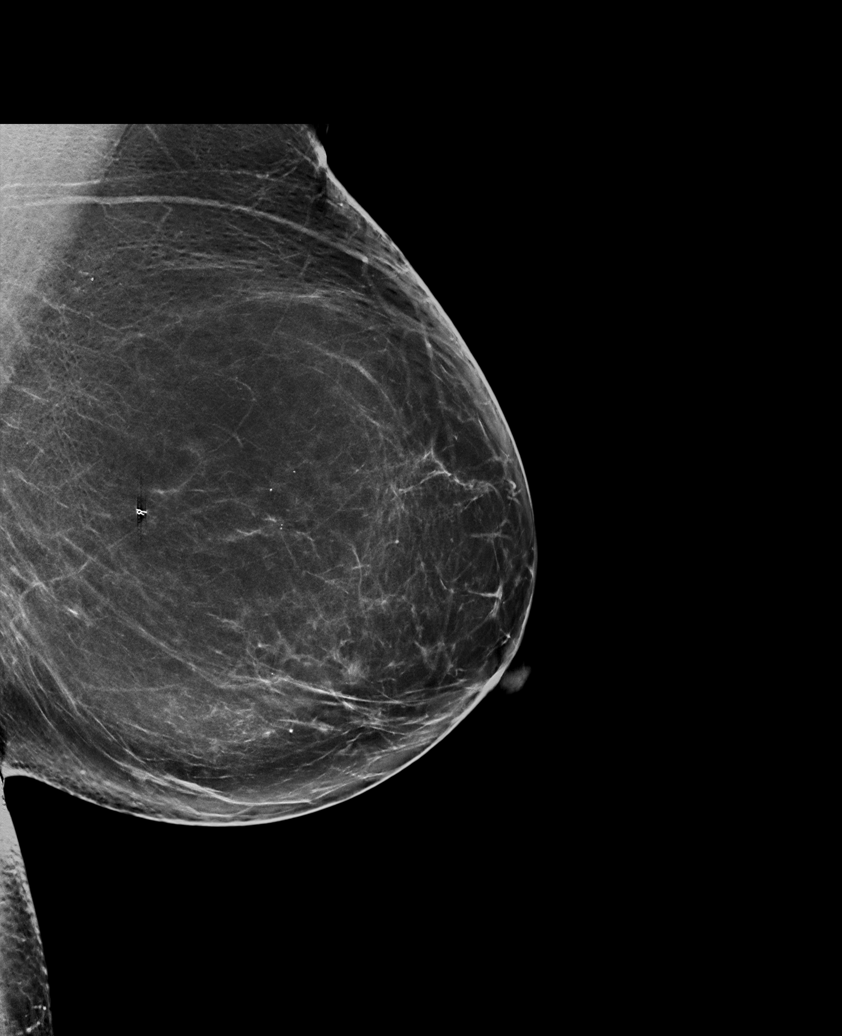

[R CC synth-2D]
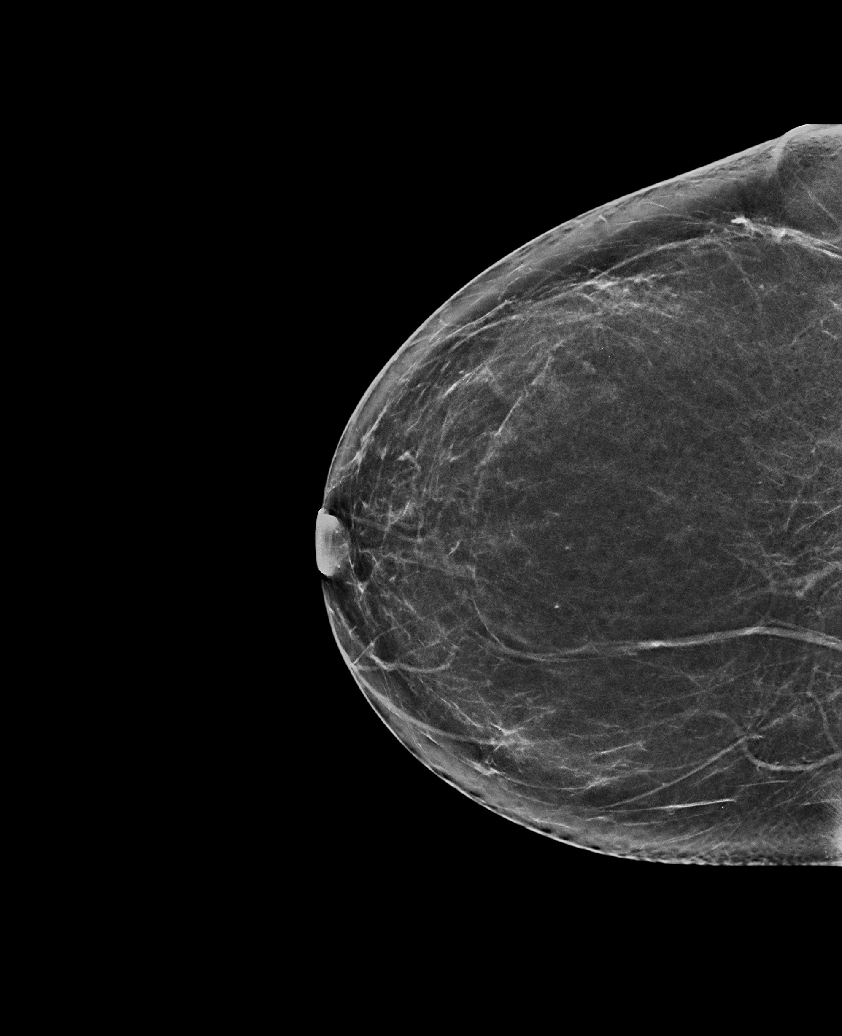

[L CC synth-2D]
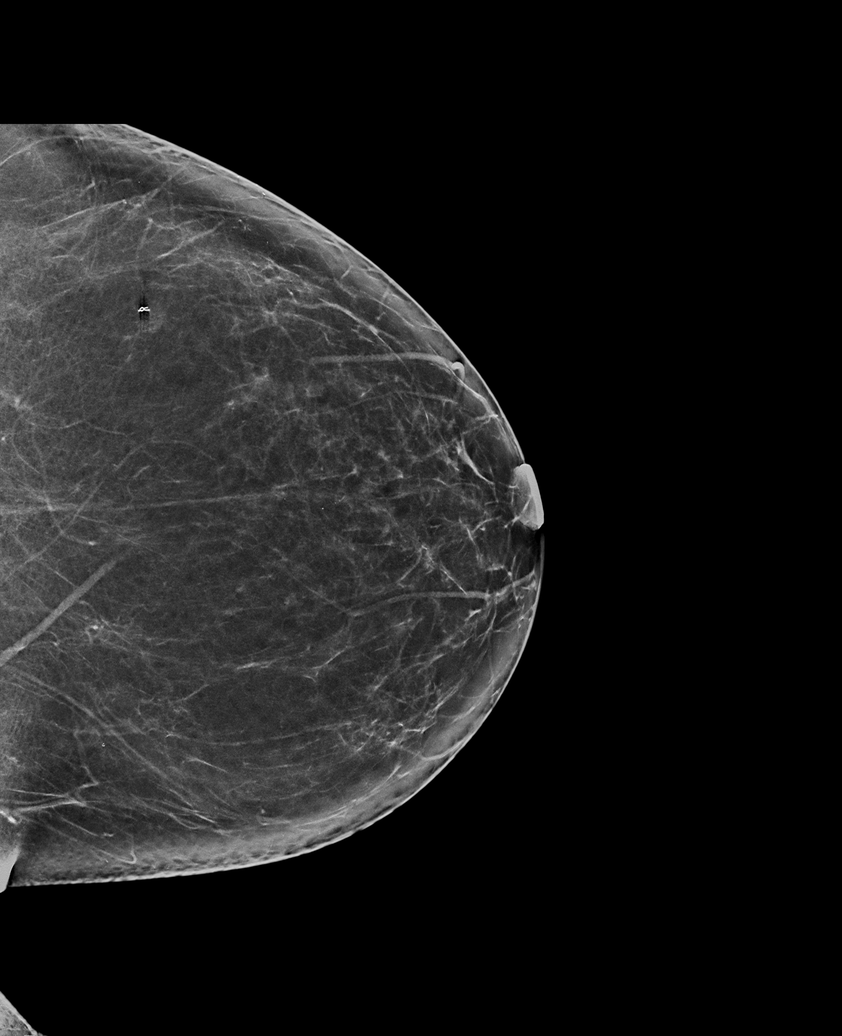

[L MLO synth-2D (2 of 2)]
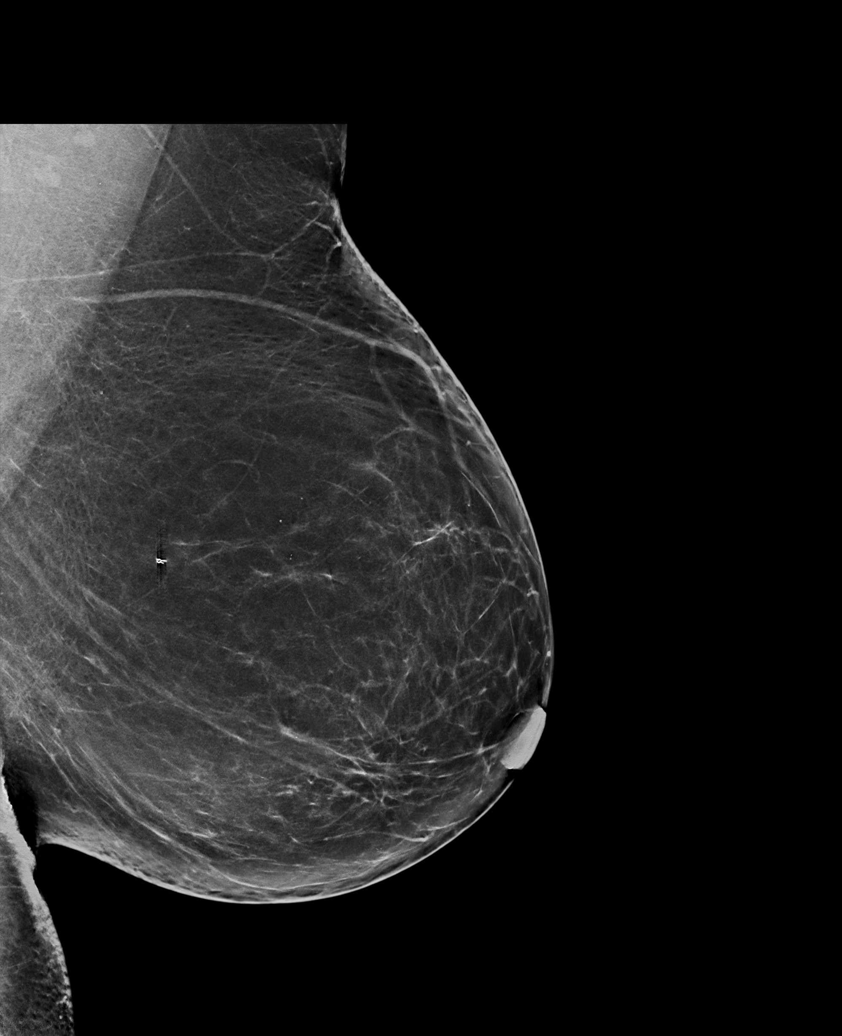

[L CC tomo · tomo slice 38/75.0]
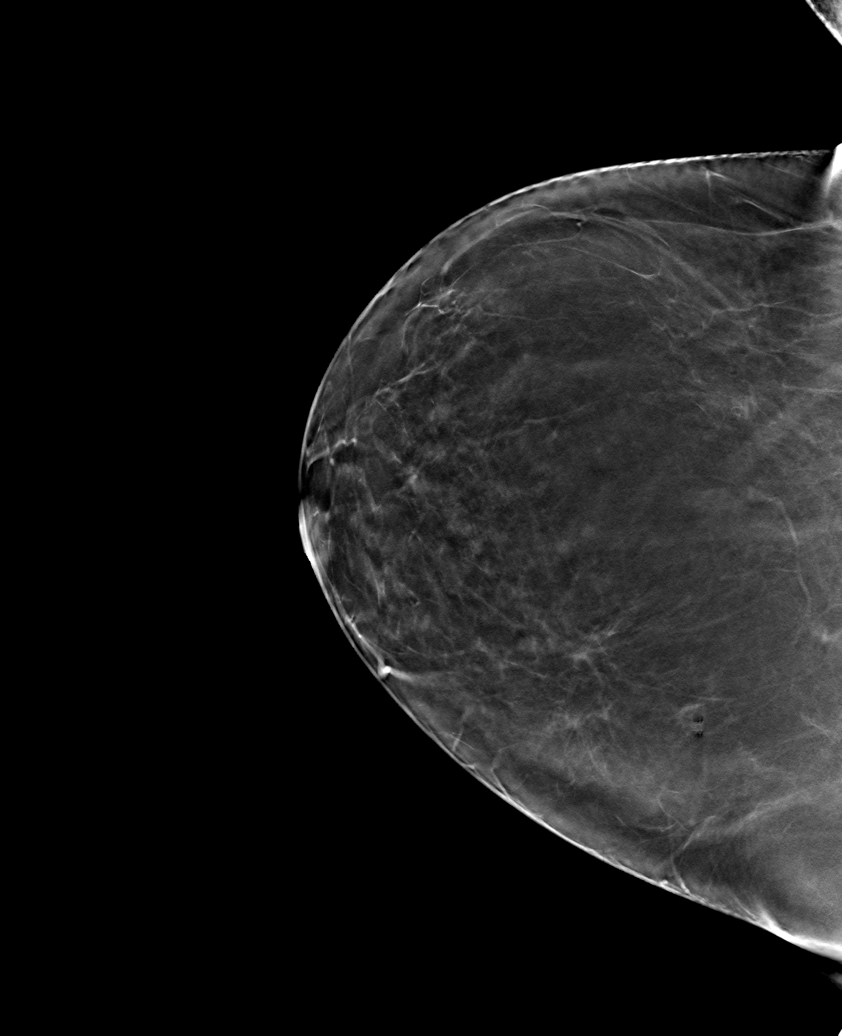

[6 of 30 positions shown; findings below may reference images not displayed]

ACR Breast Density Category b: There are scattered areas of
fibroglandular density.
FINDINGS: There are no findings suspicious for malignancy. Images were
processed with CAD.
IMPRESSION: No mammographic evidence of malignancy. A result letter of this
screening mammogram will be mailed directly to the patient.

RECOMMENDATION:
Screening mammogram in one year. (Code:[id number])

BI-RADS CATEGORY  1: Negative.

## 2020-08-23 ENCOUNTER — Ambulatory Visit
Admission: RE | Admit: 2020-08-23 | Discharge: 2020-08-23 | Disposition: A | Discharge status: Home / Self Care | Payer: Managed Care, Other (non HMO) | Source: Ambulatory Visit | Attending: Obstetrics & Gynecology | Admitting: Obstetrics & Gynecology

## 2020-08-23 ENCOUNTER — Other Ambulatory Visit: Payer: Self-pay

## 2020-08-23 DIAGNOSIS — Z1231 Encounter for screening mammogram for malignant neoplasm of breast: Secondary | ICD-10-CM | POA: Insufficient documentation

## 2020-09-29 ENCOUNTER — Ambulatory Visit (INDEPENDENT_AMBULATORY_CARE_PROVIDER_SITE_OTHER): Payer: Managed Care, Other (non HMO) | Admitting: Obstetrics & Gynecology

## 2020-09-29 ENCOUNTER — Other Ambulatory Visit: Payer: Self-pay

## 2020-09-29 ENCOUNTER — Other Ambulatory Visit (HOSPITAL_COMMUNITY)
Admission: RE | Admit: 2020-09-29 | Discharge: 2020-09-29 | Disposition: A | Discharge status: Home / Self Care | Payer: Managed Care, Other (non HMO) | Source: Ambulatory Visit | Attending: Obstetrics & Gynecology | Admitting: Obstetrics & Gynecology

## 2020-09-29 ENCOUNTER — Encounter: Payer: Self-pay | Admitting: Obstetrics & Gynecology

## 2020-09-29 VITALS — BP 128/80 | Ht 65.0 in | Wt 260.0 lb

## 2020-09-29 DIAGNOSIS — E559 Vitamin D deficiency, unspecified: Secondary | ICD-10-CM | POA: Diagnosis not present

## 2020-09-29 DIAGNOSIS — Z01419 Encounter for gynecological examination (general) (routine) without abnormal findings: Secondary | ICD-10-CM | POA: Diagnosis not present

## 2020-09-29 DIAGNOSIS — Z124 Encounter for screening for malignant neoplasm of cervix: Secondary | ICD-10-CM

## 2020-09-29 DIAGNOSIS — Z1211 Encounter for screening for malignant neoplasm of colon: Secondary | ICD-10-CM | POA: Diagnosis not present

## 2020-09-29 NOTE — Progress Notes (Signed)
HPI:      [name] is a 65 y.o. [id number] who LMP was in the past, she presents today for her annual examination.  The patient has no complaints today. The patient is sexually active. Herlast pap: approximate date 2019 and was normal and last mammogram: approximate date 2021 and was normal.  The patient does perform self breast exams.  There is no notable family history of breast or ovarian cancer in her family. Brother w Colon Cancer. The patient is not taking hormone replacement therapy. Patient denies post-menopausal vaginal bleeding.   The patient has regular exercise: yes. The patient denies current symptoms of depression.  Covid last year, better now except for concern over LFT testing anomalies.  GYN Hx: Last Colonoscopy:6 years ago. Normal.  Last DEXA: never ago.    PMHx: Past Medical History:  Diagnosis Date  . Arthritis    viral  . Diverticulitis   . Fibroids   . Heart burn   . Heart murmur   . IC (interstitial cystitis)   . Recurrent UTI    Past Surgical History:  Procedure Laterality Date  . BREAST BIOPSY Left 11/2015  . breast biopsy  Left 1987  . BREAST EXCISIONAL BIOPSY Left 1987  . CHOLECYSTECTOMY    . DILATION AND CURETTAGE OF UTERUS  2005   Family History  Problem Relation Age of Onset  . Bladder Cancer Maternal Uncle   . Kidney disease Maternal Uncle   . Colon cancer Brother   . Lung cancer Brother   . Liver cancer Brother   . Breast cancer Neg Hx    Social History   Tobacco Use  . Smoking status: Never Smoker  . Smokeless tobacco: Never Used  Vaping Use  . Vaping Use: Never used  Substance Use Topics  . Alcohol use: No    Alcohol/week: 0.0 standard drinks  . Drug use: No    Current Outpatient Medications:  .  Ascorbic Acid POWD, by Does not apply route. Reported on 11/29/2015, Disp: , Rfl:  .  D-Mannose POWD, Take by mouth., Disp: , Rfl:  .  DIGEST ENZYMES-ANTICHOLINERGIC PO, Take by mouth., Disp: , Rfl:  .  Fish Oil-Krill Oil (KRILL  OIL PLUS) CAPS, Take by mouth., Disp: , Rfl:  .  Lactobacillus ([place] PRO-B PO), Take by mouth., Disp: , Rfl:  .  Multiple Vitamins-Minerals (CENTRUM SILVER PO), Take by mouth., Disp: , Rfl:  Allergies: Naproxen, Amoxicillin-pot clavulanate, Cephalosporins, Dairy aid [lactase], Eggs or egg-derived products, and Septra [sulfamethoxazole-trimethoprim]  Review of Systems  Constitutional: Negative for chills, fever and malaise/fatigue.  HENT: Negative for congestion, sinus pain and sore throat.   Eyes: Negative for blurred vision and pain.  Respiratory: Negative for cough and wheezing.   Cardiovascular: Negative for chest pain and leg swelling.  Gastrointestinal: Negative for abdominal pain, constipation, diarrhea, heartburn, nausea and vomiting.  Genitourinary: Negative for dysuria, frequency, hematuria and urgency.  Musculoskeletal: Negative for back pain, joint pain, myalgias and neck pain.  Skin: Negative for itching and rash.  Neurological: Negative for dizziness, tremors and weakness.  Endo/Heme/Allergies: Does not bruise/bleed easily.  Psychiatric/Behavioral: Negative for depression. The patient is not nervous/anxious and does not have insomnia.     Objective: BP 128/80   Ht 5\' 5"  (1.651 m)   Wt 260 lb (117.9 kg)   BMI 43.27 kg/m   Filed Weights   09/29/20 1002  Weight: 260 lb (117.9 kg)   Body mass index is 43.27 kg/m. Physical Exam Constitutional:  General: She is not in acute distress.    Appearance: She is well-developed and well-nourished.  Genitourinary:     Vagina, uterus and rectum normal.     There is no rash or lesion on the right labia.     There is no rash or lesion on the left labia.    No lesions in the vagina.     No vaginal bleeding.      Right Adnexa: not tender and no mass present.    Left Adnexa: not tender and no mass present.    No cervical motion tenderness, friability, lesion or polyp.     Uterus is mobile.     Uterus is not enlarged.      No uterine mass detected.    Uterus is midaxial.     Pelvic exam was performed with patient supine.  Breasts:     Right: No mass, skin change or tenderness.     Left: No mass, skin change or tenderness.    HENT:     Head: Normocephalic and atraumatic. No laceration.     Right Ear: Hearing normal.     Left Ear: Hearing normal.     Nose: No epistaxis or foreign body.     Mouth/Throat:     Mouth: Oropharynx is clear and moist and mucous membranes are normal.     Pharynx: Uvula midline.  Eyes:     Pupils: Pupils are equal, round, and reactive to light.  Neck:     Thyroid: No thyromegaly.  Cardiovascular:     Rate and Rhythm: Normal rate and regular rhythm.     Heart sounds: No murmur heard. No friction rub. No gallop.   Pulmonary:     Effort: Pulmonary effort is normal. No respiratory distress.     Breath sounds: Normal breath sounds. No wheezing.  Abdominal:     General: Bowel sounds are normal. There is no distension.     Palpations: Abdomen is soft.     Tenderness: There is no abdominal tenderness. There is no rebound.  Musculoskeletal:        General: Normal range of motion.     Cervical back: Normal range of motion and neck supple.  Neurological:     Mental Status: She is alert and oriented to person, place, and time.     Cranial Nerves: No cranial nerve deficit.  Skin:    General: Skin is warm and dry.  Psychiatric:        Mood and Affect: Mood and affect normal.        Judgment: Judgment normal.  Vitals reviewed.     Assessment: Annual Exam 1. Women's annual routine gynecological examination   2. Screen for colon cancer   3. Vitamin D deficiency   4. Screening for cervical cancer     Plan:            1.  Cervical Screening-  Pap smear done today  2. Breast screening- Exam annually and mammogram scheduled  3. Colonoscopy every 10 years, Hemoccult testing after age 27  4. Labs managed by PCP  Cont Vit D therapy daily  5. Counseling for hormonal  therapy: none              6. FRAX - FRAX score for assessing the 10 year probability for fracture calculated and discussed today.  Based on age and score today, DEXA is not currently scheduled.    F/U  Return in about 1 year (around 09/29/2021) for  Annual.  Barnett Applebaum, MD, [facility], [facility] 09/29/2020  10:23 AM

## 2020-09-29 NOTE — Patient Instructions (Signed)
PAP every three years Mammogram every year    Call [phone] to schedule at [facility] in Dec Colonoscopy every 10 years Labs yearly (with PCP)  Thank you for choosing [facility]. As part of our ongoing efforts to improve patient experience, we would appreciate your feedback. Please fill out the short survey that you will receive by mail or MyChart. Your opinion is important to [place]! - Dr. Kenton Kingfisher  Recommendations to boost your immunity to prevent illness such as viral flu and colds, including covid19, are as follows:       - - -  Vitamin K2 and Vitamin D3  - - - Take Vitamin K2 at 200-300 mcg daily (usually 2-3 pills daily of the over the counter formulation). Take Vitamin D3 at 3000-4000 U daily (usually 3-4 pills daily of the over the counter formulation). Studies show that these two at high normal levels in your system are very effective in keeping your immunity so strong and protective that you will be unlikely to contract viral illness such as those listed above.  Dr Kenton Kingfisher

## 2020-09-30 LAB — CYTOLOGY - PAP
Comment: NEGATIVE
Diagnosis: NEGATIVE
High risk HPV: NEGATIVE

## 2020-11-16 LAB — FECAL OCCULT BLOOD, IMMUNOCHEMICAL: Fecal Occult Bld: NEGATIVE

## 2021-06-15 ENCOUNTER — Ambulatory Visit: Payer: Managed Care, Other (non HMO) | Admitting: Anesthesiology

## 2021-06-15 ENCOUNTER — Encounter: Payer: Self-pay | Admitting: *Deleted

## 2021-06-15 ENCOUNTER — Other Ambulatory Visit: Payer: Self-pay

## 2021-06-15 ENCOUNTER — Ambulatory Visit
Admission: RE | Admit: 2021-06-15 | Discharge: 2021-06-15 | Disposition: A | Discharge status: Home / Self Care | Payer: Managed Care, Other (non HMO) | Attending: Gastroenterology | Admitting: Gastroenterology

## 2021-06-15 ENCOUNTER — Encounter
Admission: RE | Disposition: A | Discharge status: Home / Self Care | Payer: Self-pay | Source: Home / Self Care | Attending: Gastroenterology

## 2021-06-15 DIAGNOSIS — Z886 Allergy status to analgesic agent status: Secondary | ICD-10-CM | POA: Diagnosis not present

## 2021-06-15 DIAGNOSIS — Z881 Allergy status to other antibiotic agents status: Secondary | ICD-10-CM | POA: Diagnosis not present

## 2021-06-15 DIAGNOSIS — D127 Benign neoplasm of rectosigmoid junction: Secondary | ICD-10-CM | POA: Diagnosis not present

## 2021-06-15 DIAGNOSIS — Z1211 Encounter for screening for malignant neoplasm of colon: Secondary | ICD-10-CM | POA: Insufficient documentation

## 2021-06-15 DIAGNOSIS — K219 Gastro-esophageal reflux disease without esophagitis: Secondary | ICD-10-CM | POA: Diagnosis not present

## 2021-06-15 DIAGNOSIS — Z882 Allergy status to sulfonamides status: Secondary | ICD-10-CM | POA: Diagnosis not present

## 2021-06-15 DIAGNOSIS — D122 Benign neoplasm of ascending colon: Secondary | ICD-10-CM | POA: Diagnosis not present

## 2021-06-15 DIAGNOSIS — Z800 Family history of malignant neoplasm of digestive organs: Secondary | ICD-10-CM | POA: Diagnosis not present

## 2021-06-15 DIAGNOSIS — K5730 Diverticulosis of large intestine without perforation or abscess without bleeding: Secondary | ICD-10-CM | POA: Diagnosis not present

## 2021-06-15 DIAGNOSIS — Z880 Allergy status to penicillin: Secondary | ICD-10-CM | POA: Insufficient documentation

## 2021-06-15 DIAGNOSIS — Z85038 Personal history of other malignant neoplasm of large intestine: Secondary | ICD-10-CM | POA: Diagnosis not present

## 2021-06-15 HISTORY — PX: COLONOSCOPY WITH PROPOFOL: SHX5780

## 2021-06-15 HISTORY — DX: Gastro-esophageal reflux disease without esophagitis: K21.9

## 2021-06-15 SURGERY — COLONOSCOPY WITH PROPOFOL
Anesthesia: General

## 2021-06-15 MED ORDER — SODIUM CHLORIDE 0.9 % IV SOLN: INTRAVENOUS | Status: DC

## 2021-06-15 MED ORDER — PROPOFOL 10 MG/ML IV BOLUS
INTRAVENOUS | Status: DC | PRN
  Administered 2021-06-15: 50 mg via INTRAVENOUS

## 2021-06-15 MED ORDER — LACTATED RINGERS IV SOLN: INTRAVENOUS | Status: DC | PRN

## 2021-06-15 MED ORDER — LIDOCAINE HCL (PF) 2 % IJ SOLN
INTRAMUSCULAR | Status: DC | PRN
  Administered 2021-06-15: 50 mg via INTRADERMAL

## 2021-06-15 MED ORDER — PROPOFOL 500 MG/50ML IV EMUL
INTRAVENOUS | Status: DC | PRN
  Administered 2021-06-15: 200 ug/kg/min via INTRAVENOUS

## 2021-06-15 NOTE — Transfer of Care (Signed)
Immediate Anesthesia Transfer of Care Note  Patient: Alexandra Rios  Procedure(s) Performed: COLONOSCOPY WITH PROPOFOL  Patient Location: PACU  Anesthesia Type:General  Level of Consciousness: awake, alert  and oriented  Airway & Oxygen Therapy: Patient Spontanous Breathing and Patient connected to nasal cannula oxygen  Post-op Assessment: Report given to RN and Post -op Vital signs reviewed and stable  Post vital signs: Reviewed and stable  Last Vitals:  Vitals Value Taken Time  BP    Temp    Pulse 62 06/15/21 1003  Resp 19 06/15/21 1003  SpO2 99 % 06/15/21 1003  Vitals shown include unvalidated device data.  Last Pain:  Vitals:   06/15/21 0856  TempSrc: Temporal  PainSc: 0-No pain         Complications: No notable events documented.

## 2021-06-15 NOTE — Anesthesia Postprocedure Evaluation (Signed)
Anesthesia Post Note  Patient: Alexandra Rios  Procedure(s) Performed: COLONOSCOPY WITH PROPOFOL  Patient location during evaluation: Phase II Anesthesia Type: General Level of consciousness: awake and alert, awake and oriented Pain management: pain level controlled Vital Signs Assessment: post-procedure vital signs reviewed and stable Respiratory status: spontaneous breathing, nonlabored ventilation and respiratory function stable Cardiovascular status: blood pressure returned to baseline and stable Postop Assessment: no apparent nausea or vomiting Anesthetic complications: no   No notable events documented.   Last Vitals:  Vitals:   06/15/21 0856 06/15/21 1002  BP: (!) 147/66 (!) 109/58  Pulse: 65   Resp: 18   Temp: (!) 36.3 C   SpO2: 100%     Last Pain:  Vitals:   06/15/21 1028  TempSrc:   PainSc: 0-No pain                 Phill Mutter

## 2021-06-15 NOTE — Anesthesia Procedure Notes (Signed)
Procedure Name: MAC Date/Time: 06/15/2021 9:38 AM Performed by: Nelda Marseille, CRNA Pre-anesthesia Checklist: Patient identified, Emergency Drugs available, Suction available and Patient being monitored Patient Re-evaluated:Patient Re-evaluated prior to induction Oxygen Delivery Method: Nasal cannula

## 2021-06-15 NOTE — Op Note (Signed)
[facility] Gastroenterology Patient Name: Alexandra Rios Procedure Date: 06/15/2021 9:18 AM MRN: [id number] Account #: [id number] Date of Birth: 1956-04-13 Admit Type: Outpatient Age: 65 Room: [facility] ENDO ROOM 1 Gender: Female Note Status: Finalized Instrument Name: Jasper Riling [id number] Procedure:             Colonoscopy Indications:           Screening patient at increased risk: Family history of                         1st-degree relative with colorectal cancer at age 79                         years (or older) Providers:             Rueben Bash, DO Referring MD:          Leonie Douglas. Doy Hutching, MD (Referring MD) Medicines:             Monitored Anesthesia Care Complications:         No immediate complications. Estimated blood loss:                         Minimal. Procedure:             Pre-Anesthesia Assessment:                        - Prior to the procedure, a History and Physical was                         performed, and patient medications and allergies were                         reviewed. The patient is competent. The risks and                         benefits of the procedure and the sedation options and                         risks were discussed with the patient. All questions                         were answered and informed consent was obtained.                         Patient identification and proposed procedure were                         verified by the physician, the nurse, the anesthetist                         and the technician in the endoscopy suite. Mental                         Status Examination: alert and oriented. Airway                         Examination: normal oropharyngeal airway and neck  mobility. Respiratory Examination: clear to                         auscultation. CV Examination: RRR, no murmurs, no S3                         or S4. Prophylactic Antibiotics: The patient does not                          require prophylactic antibiotics. Prior                         Anticoagulants: The patient has taken no previous                         anticoagulant or antiplatelet agents. ASA Grade                         Assessment: III - A patient with severe systemic                         disease. After reviewing the risks and benefits, the                         patient was deemed in satisfactory condition to                         undergo the procedure. The anesthesia plan was to use                         monitored anesthesia care (MAC). Immediately prior to                         administration of medications, the patient was                         re-assessed for adequacy to receive sedatives. The                         heart rate, respiratory rate, oxygen saturations,                         blood pressure, adequacy of pulmonary ventilation, and                         response to care were monitored throughout the                         procedure. The physical status of the patient was                         re-assessed after the procedure.                        After obtaining informed consent, the colonoscope was                         passed under direct vision. Throughout the procedure,  the patient's blood pressure, pulse, and oxygen                         saturations were monitored continuously. The                         Colonoscope was introduced through the anus and                         advanced to the the terminal ileum, with                         identification of the appendiceal orifice and IC                         valve. The colonoscopy was performed without                         difficulty. The patient tolerated the procedure well.                         The quality of the bowel preparation was evaluated                         using the BBPS [facility] Bowel Preparation Scale) with                         scores of:  Right Colon = 2 (minor amount of residual                         staining, small fragments of stool and/or opaque                         liquid, but mucosa seen well), Transverse Colon = 3                         (entire mucosa seen well with no residual staining,                         small fragments of stool or opaque liquid) and Left                         Colon = 3 (entire mucosa seen well with no residual                         staining, small fragments of stool or opaque liquid).                         The total BBPS score equals 8. The quality of the                         bowel preparation was excellent. Findings:      The perianal and digital rectal examinations were normal. Pertinent       negatives include normal sphincter tone.      The terminal ileum appeared normal.      A 6 to 7 mm polyp was found in the proximal ascending colon.  The polyp       was sessile. The polyp was removed with a cold snare. Resection and       retrieval were complete. Estimated blood loss was minimal.      Two sessile polyps were found in the recto-sigmoid colon. The polyps       were 1 to 3 mm in size. These polyps were removed with a cold biopsy       forceps. Resection and retrieval were complete. Estimated blood loss was       minimal.      Multiple small-mouthed diverticula were found in the entire colon.       Estimated blood loss was minimal.      The exam was otherwise without abnormality on direct and retroflexion       views. Impression:            - The examined portion of the ileum was normal.                        - One 6 to 7 mm polyp in the proximal ascending colon,                         removed with a cold snare. Resected and retrieved.                        - Two 1 to 3 mm polyps at the recto-sigmoid colon,                         removed with a cold biopsy forceps. Resected and                         retrieved.                        - Diverticulosis in the entire  examined colon.                        - The examination was otherwise normal on direct and                         retroflexion views. Recommendation:        - Discharge patient to home.                        - Resume previous diet.                        - Continue present medications.                        - Await pathology results.                        - Repeat colonoscopy for surveillance based on                         pathology results.                        - Return to referring physician as previously  scheduled. Procedure Code(s):     --- Professional ---                        [phone], Colonoscopy, flexible; with removal of                         tumor(s), polyp(s), or other lesion(s) by snare                         technique                        45380, 30, Colonoscopy, flexible; with biopsy, single                         or multiple Diagnosis Code(s):     --- Professional ---                        K63.5, Polyp of colon                        Z80.0, Family history of malignant neoplasm of                         digestive organs                        K57.30, Diverticulosis of large intestine without                         perforation or abscess without bleeding CPT copyright 2019 American Medical Association. All rights reserved. The codes documented in this report are preliminary and upon coder review may  be revised to meet current compliance requirements. Attending Participation:      I personally performed the entire procedure. Volney American, DO Annamaria Helling DO, DO 06/15/2021 10:03:22 AM This report has been signed electronically. Number of Addenda: 0 Note Initiated On: 06/15/2021 9:18 AM Scope Withdrawal Time: 0 hours 14 minutes 1 second  Total Procedure Duration: 0 hours 17 minutes 41 seconds  Estimated Blood Loss:  Estimated blood loss was minimal.      [facility]

## 2021-06-15 NOTE — H&P (Signed)
[facility] Pre-Procedure H&P   Patient ID: Alexandra Rios is a 65 y.o. female.  Gastroenterology Provider: Annamaria Helling, DO  Referring Provider: Dr. Doy Hutching PCP: Idelle Crouch, MD  Date: 06/15/2021  HPI Alexandra Rios is a 65 y.o. female who presents today for Colonoscopy for fhx of colon cancer- brother 23 y/o.  Last colonoscopy 08/2013 with Dr. Candace Cruise, noting diverticulosis w/o polyps; Hgb 13.6 with mcv of 94.  Denies constipation diarrhea hematochezia, diarrhea.  No other acute gi complaints.  Past Medical History:  Diagnosis Date   Arthritis    viral   Diverticulitis    Fibroids    GERD (gastroesophageal reflux disease)    Heart burn    Heart murmur    IC (interstitial cystitis)    Recurrent UTI     Past Surgical History:  Procedure Laterality Date   BREAST BIOPSY Left 11/2015   breast biopsy  Left 1987   BREAST EXCISIONAL BIOPSY Left [place] AND CURETTAGE OF UTERUS  2005    Family History Brother - 43 y/o colon cancer No other h/o GI disease or malignancy  Review of Systems  Constitutional:  Negative for activity change, appetite change, fatigue, fever and unexpected weight change.  HENT:  Negative for trouble swallowing and voice change.   Respiratory:  Negative for shortness of breath and wheezing.   Cardiovascular:  Negative for chest pain and palpitations.  Gastrointestinal:  Negative for abdominal distention, abdominal pain, anal bleeding, blood in stool, constipation, diarrhea, nausea, rectal pain and vomiting.  Musculoskeletal:  Negative for arthralgias and myalgias.  Skin:  Negative for color change and pallor.  Neurological:  Negative for dizziness, syncope and weakness.  Psychiatric/Behavioral:  Negative for confusion.   All other systems reviewed and are negative.   Medications No current facility-administered medications on file prior to encounter.   Current Outpatient Medications  on File Prior to Encounter  Medication Sig Dispense Refill   b complex vitamins capsule Take 1 capsule by mouth daily.     DIGEST ENZYMES-ANTICHOLINERGIC PO Take by mouth.     LYSINE PO Take 2 g by mouth in the morning and at bedtime.     TART CHERRY PO Take 2 tablets by mouth daily. 52:1 concentrate for 1200 mg total     VITAMIN D, ERGOCALCIFEROL, PO Take 10,000 mg by mouth daily.     vitamin E 180 MG (400 UNITS) capsule Take 400 Units by mouth daily.     VITAMIN K PO Take 180 mg by mouth daily.     Zinc Sulfate (ZINC 15 PO) Take 10 mg by mouth daily.     Ascorbic Acid POWD Take 2,000 mg by mouth daily. Reported on 11/29/2015 Vitamin C tablets     D-Mannose POWD Take by mouth.     Fish Oil-Krill Oil (KRILL OIL PLUS) CAPS Take by mouth. (Patient not taking: Reported on 06/15/2021)     Lactobacillus ([place] PRO-B PO) Take by mouth. (Patient not taking: Reported on 06/15/2021)     Multiple Vitamins-Minerals (CENTRUM SILVER PO) Take by mouth. (Patient not taking: Reported on 06/15/2021)      Pertinent medications related to GI and procedure were reviewed by me with the patient prior to the procedure   Current Facility-Administered Medications:    0.9 %  sodium chloride infusion, , Intravenous, Continuous, Annamaria Helling, DO, Last Rate: 20 mL/hr at 06/15/21 0923, New Bag at 06/15/21 0923  sodium chloride  20 mL/hr at 06/15/21 4982       Allergies  Allergen Reactions   Naproxen Rash    Breaks out with red bumps   Amoxicillin-Pot Clavulanate Nausea And Vomiting   Cephalosporins Itching   Septra [Sulfamethoxazole-Trimethoprim] Itching   Allergies were reviewed by me prior to the procedure  Objective    Vitals:   06/15/21 0856  BP: (!) 147/66  Pulse: 65  Resp: 18  Temp: (!) 97.3 F (36.3 C)  TempSrc: Temporal  SpO2: 100%  Weight: 108.9 kg  Height: 5\' 5"  (1.651 m)     Physical Exam Vitals reviewed.  Constitutional:      General: She is not in acute distress.     Appearance: Normal appearance. She is obese. She is not ill-appearing, toxic-appearing or diaphoretic.  HENT:     Head: Normocephalic and atraumatic.     Nose: Nose normal.     Mouth/Throat:     Mouth: Mucous membranes are moist.     Pharynx: Oropharynx is clear.  Eyes:     General: No scleral icterus.    Extraocular Movements: Extraocular movements intact.  Cardiovascular:     Rate and Rhythm: Normal rate and regular rhythm.     Heart sounds: Normal heart sounds. No murmur heard.   No friction rub. No gallop.  Pulmonary:     Effort: Pulmonary effort is normal. No respiratory distress.     Breath sounds: Normal breath sounds. No wheezing, rhonchi or rales.  Abdominal:     General: Bowel sounds are normal. There is no distension.     Palpations: Abdomen is soft.     Tenderness: There is no abdominal tenderness. There is no guarding or rebound.  Musculoskeletal:     Cervical back: Neck supple.     Right lower leg: No edema.     Left lower leg: No edema.  Skin:    General: Skin is warm and dry.     Coloration: Skin is not jaundiced or pale.  Neurological:     General: No focal deficit present.     Mental Status: She is alert and oriented to person, place, and time. Mental status is at baseline.  Psychiatric:        Mood and Affect: Mood normal.        Behavior: Behavior normal.        Thought Content: Thought content normal.        Judgment: Judgment normal.     Assessment:  Alexandra Rios is a 65 y.o. female  who presents today for Colonoscopy for surveillance colonoscopy with fhx of colon cancer- brother 12 y/o.  Plan:  Colonoscopy with possible intervention today  Colonoscopy with possible biopsy, control of bleeding, polypectomy, and interventions as necessary has been discussed with the patient/patient representative. Informed consent was obtained from the patient/patient representative after explaining the indication, nature, and risks of the procedure including  but not limited to death, bleeding, perforation, missed neoplasm/lesions, cardiorespiratory compromise, and reaction to medications. Opportunity for questions was given and appropriate answers were provided. Patient/patient representative has verbalized understanding is amenable to undergoing the procedure.   Annamaria Helling, DO  [facility]  Portions of the record may have been created with voice recognition software. Occasional wrong-word or 'sound-a-like' substitutions may have occurred due to the inherent limitations of voice recognition software.  Read the chart carefully and recognize, using context, where substitutions may have occurred.

## 2021-06-15 NOTE — Anesthesia Preprocedure Evaluation (Signed)
Anesthesia Evaluation  Patient identified by MRN, date of birth, ID band Patient awake    Reviewed: Allergy & Precautions, NPO status , Patient's Chart, lab work & pertinent test results  Airway Mallampati: II  TM Distance: >3 FB Neck ROM: Full    Dental no notable dental hx.    Pulmonary neg pulmonary ROS,    Pulmonary exam normal        Cardiovascular hypertension (Pt Denies), Pt. on medications Normal cardiovascular exam+ Valvular Problems/Murmurs      Neuro/Psych  Headaches, negative psych ROS   GI/Hepatic Neg liver ROS, GERD  ,  Endo/Other  negative endocrine ROS  Renal/GU negative Renal ROS  negative genitourinary   Musculoskeletal  (+) Arthritis ,   Abdominal   Peds negative pediatric ROS (+)  Hematology negative hematology ROS (+) anemia ,   Anesthesia Other Findings . Allergic state  . Anemia  . Arthritis  . Eczema, unspecified  . Fibrocystic breast disease  . GERD (gastroesophageal reflux disease)  . Hyperlipidemia  . Hypertension  . Migraine  . Obesity  . Recurrent UTI    Reproductive/Obstetrics negative OB ROS                            Anesthesia Physical Anesthesia Plan  ASA: 2  Anesthesia Plan: General   Post-op Pain Management:    Induction: Intravenous  PONV Risk Score and Plan: 2 and Propofol infusion and TIVA  Airway Management Planned: Natural Airway and Nasal Cannula  Additional Equipment:   Intra-op Plan:   Post-operative Plan:   Informed Consent: I have reviewed the patients History and Physical, chart, labs and discussed the procedure including the risks, benefits and alternatives for the proposed anesthesia with the patient or authorized representative who has indicated his/her understanding and acceptance.       Plan Discussed with: CRNA, Anesthesiologist and Surgeon  Anesthesia Plan Comments:         Anesthesia Quick  Evaluation

## 2021-06-15 NOTE — Interval H&P Note (Signed)
History and Physical Interval Note: Preprocedure H&P from 06/15/21  was reviewed and there was no interval change after seeing and examining the patient.  Written consent was obtained from the patient after discussion of risks, benefits, and alternatives. Patient has consented to proceed with Colonoscopy with possible intervention   06/15/2021 9:34 AM  Eugenio Hoes  has presented today for surgery, with the diagnosis of FAM HX COLON CANCER.  The various methods of treatment have been discussed with the patient and family. After consideration of risks, benefits and other options for treatment, the patient has consented to  Procedure(s): COLONOSCOPY WITH PROPOFOL (N/A) as a surgical intervention.  The patient's history has been reviewed, patient examined, no change in status, stable for surgery.  I have reviewed the patient's chart and labs.  Questions were answered to the patient's satisfaction.     Annamaria Helling

## 2021-06-16 ENCOUNTER — Encounter: Payer: Self-pay | Admitting: Gastroenterology

## 2021-06-16 LAB — SURGICAL PATHOLOGY

## 2021-08-25 ENCOUNTER — Other Ambulatory Visit: Payer: Self-pay | Admitting: General Surgery

## 2021-08-25 DIAGNOSIS — K439 Ventral hernia without obstruction or gangrene: Secondary | ICD-10-CM

## 2021-09-19 ENCOUNTER — Other Ambulatory Visit: Payer: Self-pay

## 2021-09-19 ENCOUNTER — Ambulatory Visit
Admission: RE | Admit: 2021-09-19 | Discharge: 2021-09-19 | Disposition: A | Discharge status: Home / Self Care | Payer: Managed Care, Other (non HMO) | Source: Ambulatory Visit | Attending: General Surgery | Admitting: General Surgery

## 2021-09-19 DIAGNOSIS — K439 Ventral hernia without obstruction or gangrene: Secondary | ICD-10-CM

## 2021-09-19 LAB — POCT I-STAT CREATININE: Creatinine, Ser: 0.7 mg/dL (ref 0.44–1.00)

## 2021-09-19 MED ORDER — IOHEXOL 300 MG/ML  SOLN
100.0000 mL | Freq: Once | INTRAMUSCULAR | Status: AC | PRN
  Administered 2021-09-19: 100 mL via INTRAVENOUS

## 2021-10-12 ENCOUNTER — Other Ambulatory Visit: Payer: Self-pay | Admitting: Physician Assistant

## 2021-10-12 ENCOUNTER — Other Ambulatory Visit: Payer: Self-pay

## 2021-10-12 ENCOUNTER — Ambulatory Visit
Admission: RE | Admit: 2021-10-12 | Discharge: 2021-10-12 | Disposition: A | Discharge status: Home / Self Care | Payer: Managed Care, Other (non HMO) | Source: Ambulatory Visit | Attending: Physician Assistant | Admitting: Physician Assistant

## 2021-10-12 DIAGNOSIS — M79605 Pain in left leg: Secondary | ICD-10-CM | POA: Diagnosis not present

## 2021-10-28 ENCOUNTER — Emergency Department: Payer: Managed Care, Other (non HMO)

## 2021-10-28 ENCOUNTER — Other Ambulatory Visit: Payer: Self-pay

## 2021-10-28 ENCOUNTER — Emergency Department
Admission: EM | Admit: 2021-10-28 | Discharge: 2021-10-28 | Disposition: A | Discharge status: Home / Self Care | Payer: Managed Care, Other (non HMO) | Attending: Emergency Medicine | Admitting: Emergency Medicine

## 2021-10-28 DIAGNOSIS — S60941A Unspecified superficial injury of left index finger, initial encounter: Secondary | ICD-10-CM | POA: Diagnosis present

## 2021-10-28 DIAGNOSIS — S61211A Laceration without foreign body of left index finger without damage to nail, initial encounter: Secondary | ICD-10-CM | POA: Insufficient documentation

## 2021-10-28 DIAGNOSIS — W268XXA Contact with other sharp object(s), not elsewhere classified, initial encounter: Secondary | ICD-10-CM | POA: Insufficient documentation

## 2021-10-28 DIAGNOSIS — S61210A Laceration without foreign body of right index finger without damage to nail, initial encounter: Secondary | ICD-10-CM

## 2021-10-28 MED ORDER — LIDOCAINE HCL (PF) 1 % IJ SOLN
5.0000 mL | Freq: Once | INTRAMUSCULAR | Status: DC
Start: 2021-10-28 — End: 2021-10-28
  Filled 2021-10-28: qty 5

## 2021-10-28 NOTE — ED Provider Notes (Signed)
[facility] Provider Note    Event Date/Time   First MD Initiated Contact with Patient 10/28/21 1357     (approximate)   History   Laceration   HPI  Alexandra Rios is a 66 y.o. female presents with laceration to the right index finger that she cut on a can this morning.  No numbness or tingling.  Has full range of motion.  Unsure of her last tetanus but does not want one.      Physical Exam   Triage Vital Signs: ED Triage Vitals  Enc Vitals Group     BP 10/28/21 1343 (!) 149/82     Pulse Rate 10/28/21 1343 64     Resp 10/28/21 1343 18     Temp 10/28/21 1343 98.6 F (37 C)     Temp src --      SpO2 10/28/21 1343 98 %     Weight 10/28/21 1345 238 lb 1.6 oz (108 kg)     Height 10/28/21 1345 5\' 4"  (1.626 m)     Head Circumference --      Peak Flow --      Pain Score 10/28/21 1344 2     Pain Loc --      Pain Edu? --      Excl. in [place]? --     Most recent vital signs: Vitals:   10/28/21 1343  BP: (!) 149/82  Pulse: 64  Resp: 18  Temp: 98.6 F (37 C)  SpO2: 98%     General: Awake, no distress.   CV:  Good peripheral perfusion. regular rate and  rhythm Resp:  Normal effort.  Abd:  No distention.   Other:  2 cm laceration noted on the dorsum of the right index finger near the knuckle   ED Results / Procedures / Treatments   Labs (all labs ordered are listed, but only abnormal results are displayed) Labs Reviewed - No data to display   EKG     RADIOLOGY     PROCEDURES:   .Marland KitchenLaceration Repair  Date/Time: 10/28/2021 4:15 PM Performed by: Versie Starks, PA-C Authorized by: Versie Starks, PA-C   Consent:    Consent obtained:  Verbal   Consent given by:  Patient   Risks discussed:  Infection, pain, retained foreign body, tendon damage, poor cosmetic result, need for additional repair, nerve damage, poor wound healing and vascular damage   Alternatives discussed:  No treatment Universal protocol:    Procedure  explained and questions answered to patient or proxy's satisfaction: yes     Immediately prior to procedure, a time out was called: yes     Patient identity confirmed:  Verbally with patient Anesthesia:    Anesthesia method:  Local infiltration   Local anesthetic:  Lidocaine 1% w/o epi Laceration details:    Location:  Finger   Finger location:  R index finger   Length (cm):  2 Pre-procedure details:    Preparation:  Patient was prepped and draped in usual sterile fashion and imaging obtained to evaluate for foreign bodies Exploration:    Hemostasis achieved with:  Direct pressure   Imaging obtained: x-ray     Imaging outcome: foreign body not noted     Wound exploration: wound explored through full range of motion     Wound extent: no areolar tissue violation noted, no fascia violation noted, no foreign bodies/material noted, no muscle damage noted, no nerve damage noted, no tendon damage noted, no underlying  fracture noted and no vascular damage noted     Contaminated: no   Treatment:    Area cleansed with:  Povidone-iodine and saline   Amount of cleaning:  Standard   Irrigation solution:  Sterile saline   Irrigation method:  Syringe and tap Skin repair:    Repair method:  Sutures   Suture size:  5-0   Suture material:  Nylon   Suture technique:  Simple interrupted   Number of sutures:  4 Approximation:    Approximation:  Close Repair type:    Repair type:  Simple Post-procedure details:    Dressing:  Non-adherent dressing   Procedure completion:  Tolerated well, no immediate complications   MEDICATIONS ORDERED IN ED: Medications  lidocaine (PF) (XYLOCAINE) 1 % injection 5 mL (has no administration in time range)     IMPRESSION / MDM / ASSESSMENT AND PLAN / ED COURSE  I reviewed the triage vital signs and the nursing notes.                             The patient is a 66 year old female presents laceration to the right index finger.  She cut it on a tomato sauce  can.  Unsure of last Tdap.  However discussed with the patient she does not want to get a Tdap today.  She wants to check with her doctor.  She had many questions about the vaccine.  Explained to her it does prevent lockjaw and pertussis.  Explained to her anytime the skin is cut that a Tdap should be updated if she has not had a Tdap within the last 10 years.  Patient still deferring the Tdap.  See procedure note for laceration repair.  Patient did tolerate procedure well. Dressing was applied by nursing staff.  Patient was discharged in stable condition.  She was instructed to follow-up with her regular doctor in 1 week for suture removal.  Return emergency department or see her doctor for any sign of infection.  Suture care was discussed         FINAL CLINICAL IMPRESSION(S) / ED DIAGNOSES   Final diagnoses:  Laceration of right index finger without foreign body without damage to nail, initial encounter     Rx / DC Orders   ED Discharge Orders     None        Note:  This document was prepared using Dragon voice recognition software and may include unintentional dictation errors.    Sylvan, [place], PA-C 10/28/21 1619    Lucrezia Starch, MD 10/28/21 [phone]

## 2021-10-28 NOTE — ED Triage Notes (Signed)
Pt with laceration about an inch long  on right index finger proximal knuckle from top of tin can. Pt noticed "something white, possibly a tendon" that she saw after cutting herself. Pt placed pressure on wound x one hour and no more bleeding noted. Pt with full ROM on right hand Pt with clean bandage, no drainage noted. Pt denies taking blood thinners.

## 2021-10-28 NOTE — ED Notes (Signed)
Rn to bedside. Pt getting XRAYS of finger. Pt CAOx4 and in no acute distress.

## 2021-10-28 NOTE — Discharge Instructions (Signed)
Keep the area as dry as possible.  Take Tylenol or ibuprofen for pain if needed.  After 24 hours she may remove the bandage and wash the area very gently with soap and water.  Have the sutures removed in 1 week.  Return if any sign of infection or see your regular doctor.

## 2021-11-15 ENCOUNTER — Telehealth: Payer: Self-pay | Admitting: Obstetrics & Gynecology

## 2021-11-15 NOTE — Telephone Encounter (Signed)
PT called in and wanted to schedule a annual with you before you leave. So I'm just reaching out to you and see what you want me to do. Thank you so much ?

## 2021-11-16 NOTE — Telephone Encounter (Signed)
Sch Apr 5 at 4:15

## 2021-12-06 ENCOUNTER — Ambulatory Visit: Payer: Managed Care, Other (non HMO) | Admitting: Obstetrics & Gynecology

## 2022-07-18 ENCOUNTER — Other Ambulatory Visit: Payer: Self-pay

## 2022-07-18 DIAGNOSIS — Z1231 Encounter for screening mammogram for malignant neoplasm of breast: Secondary | ICD-10-CM

## 2022-08-02 ENCOUNTER — Ambulatory Visit (INDEPENDENT_AMBULATORY_CARE_PROVIDER_SITE_OTHER): Payer: Managed Care, Other (non HMO) | Admitting: Nurse Practitioner

## 2022-08-02 ENCOUNTER — Encounter (INDEPENDENT_AMBULATORY_CARE_PROVIDER_SITE_OTHER): Payer: Self-pay | Admitting: Nurse Practitioner

## 2022-08-02 VITALS — BP 149/69 | HR 68 | Resp 16 | Ht 65.0 in | Wt 258.0 lb

## 2022-08-02 DIAGNOSIS — I10 Essential (primary) hypertension: Secondary | ICD-10-CM | POA: Insufficient documentation

## 2022-08-02 DIAGNOSIS — M5441 Lumbago with sciatica, right side: Secondary | ICD-10-CM

## 2022-08-02 DIAGNOSIS — M7989 Other specified soft tissue disorders: Secondary | ICD-10-CM | POA: Diagnosis not present

## 2022-08-02 DIAGNOSIS — G8929 Other chronic pain: Secondary | ICD-10-CM | POA: Diagnosis not present

## 2022-08-02 DIAGNOSIS — E669 Obesity, unspecified: Secondary | ICD-10-CM | POA: Insufficient documentation

## 2022-08-02 DIAGNOSIS — D649 Anemia, unspecified: Secondary | ICD-10-CM | POA: Insufficient documentation

## 2022-08-07 ENCOUNTER — Other Ambulatory Visit (INDEPENDENT_AMBULATORY_CARE_PROVIDER_SITE_OTHER): Payer: Self-pay | Admitting: Nurse Practitioner

## 2022-08-07 DIAGNOSIS — R609 Edema, unspecified: Secondary | ICD-10-CM

## 2022-08-07 DIAGNOSIS — M79605 Pain in left leg: Secondary | ICD-10-CM

## 2022-08-09 ENCOUNTER — Ambulatory Visit (INDEPENDENT_AMBULATORY_CARE_PROVIDER_SITE_OTHER): Payer: Managed Care, Other (non HMO)

## 2022-08-09 ENCOUNTER — Ambulatory Visit (INDEPENDENT_AMBULATORY_CARE_PROVIDER_SITE_OTHER): Payer: Managed Care, Other (non HMO) | Admitting: Nurse Practitioner

## 2022-08-09 ENCOUNTER — Encounter (INDEPENDENT_AMBULATORY_CARE_PROVIDER_SITE_OTHER): Payer: Self-pay | Admitting: Nurse Practitioner

## 2022-08-09 VITALS — BP 165/70 | HR 59 | Resp 14 | Ht 65.0 in | Wt 260.0 lb

## 2022-08-09 DIAGNOSIS — G43909 Migraine, unspecified, not intractable, without status migrainosus: Secondary | ICD-10-CM | POA: Insufficient documentation

## 2022-08-09 DIAGNOSIS — R609 Edema, unspecified: Secondary | ICD-10-CM | POA: Diagnosis not present

## 2022-08-09 DIAGNOSIS — M79605 Pain in left leg: Secondary | ICD-10-CM | POA: Diagnosis not present

## 2022-08-09 DIAGNOSIS — I890 Lymphedema, not elsewhere classified: Secondary | ICD-10-CM | POA: Diagnosis not present

## 2022-08-09 DIAGNOSIS — M1990 Unspecified osteoarthritis, unspecified site: Secondary | ICD-10-CM

## 2022-08-09 NOTE — Progress Notes (Signed)
Subjective:    Patient ID: Alexandra Rios, female    DOB: [birth year], 66 y.o.   MRN: [id number] Chief Complaint  Patient presents with   Follow-up    ultrasound    Alexandra Rios is a 66 year old female who presents today for evaluation of lower extremity edema from Dr. Doy Hutching.  She denies any pain with rest or activity however it is more painful with touch in her left lower extremity.  It typically occurs with deeper palpation.  The area is also firm.  The patient does note that there is some swelling in the bilateral lower extremities but typically the left is worse than the right.  The patient is active as she has been working out regularly.  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  Currently there are no open wounds or ulcerations.  She does endorse having some calf pain in her right lower extremity but also has a history of sciatica.  Today noninvasive studies show no evidence of DVT or superficial thrombophlebitis bilaterally.  No evidence of deep venous insufficiency bilaterally.  No evidence of superficial venous reflux bilaterally.  The area of concern in the left lower extremity shows dense hyperechoic tissue likely caused by chronic edema.  Left lower extremity arterial waveforms show normal triphasic waveforms.    Review of Systems  Cardiovascular:  Positive for leg swelling.  All other systems reviewed and are negative.      Objective:   Physical Exam Vitals reviewed.  HENT:     Head: Normocephalic.  Cardiovascular:     Rate and Rhythm: Normal rate.     Pulses: Normal pulses.  Pulmonary:     Effort: Pulmonary effort is normal.  Musculoskeletal:     Left lower leg: Edema present.  Neurological:     Mental Status: She is alert and oriented to person, place, and time.  Psychiatric:        Mood and Affect: Mood normal.        Behavior: Behavior normal.        Thought Content: Thought content normal.        Judgment: Judgment normal.      BP (!) 165/70 (BP Location: Right Arm)   Pulse (!) 59   Resp 14   Ht '5\' 5"'$  (1.651 m)   Wt 260 lb (117.9 kg)   BMI 43.27 kg/m   Past Medical History:  Diagnosis Date   Arthritis    viral   Diverticulitis    Fibroids    GERD (gastroesophageal reflux disease)    Heart burn    Heart murmur    IC (interstitial cystitis)    Recurrent UTI     Social History   Socioeconomic History   Marital status: Married    Spouse name: Not on file   Number of children: Not on file   Years of education: Not on file   Highest education level: Not on file  Occupational History   Not on file  Tobacco Use   Smoking status: Never   Smokeless tobacco: Never  Vaping Use   Vaping Use: Never used  Substance and Sexual Activity   Alcohol use: No    Alcohol/week: 0.0 standard drinks of alcohol   Drug use: No   Sexual activity: Yes  Other Topics Concern   Not on file  Social History Narrative   Not on file   Social Determinants of Health   Financial Resource Strain: Not on file  Food  Insecurity: Not on file  Transportation Needs: Not on file  Physical Activity: Not on file  Stress: Not on file  Social Connections: Not on file  Intimate Partner Violence: Not on file    Past Surgical History:  Procedure Laterality Date   BREAST BIOPSY Left 11/2015   breast biopsy  Left 1987   BREAST EXCISIONAL BIOPSY Left 1987   CHOLECYSTECTOMY     COLONOSCOPY WITH PROPOFOL N/A 06/15/2021   Procedure: COLONOSCOPY WITH PROPOFOL;  Surgeon: Annamaria Helling, DO;  Location: [facility];  Service: Gastroenterology;  Laterality: N/A;   DILATION AND CURETTAGE OF UTERUS  2005    Family History  Problem Relation Age of Onset   Bladder Cancer Maternal Uncle    Kidney disease Maternal Uncle    Colon cancer Brother    Lung cancer Brother    Liver cancer Brother    Breast cancer Neg Hx     Allergies  Allergen Reactions   Naproxen Rash    Breaks out with red bumps   Amoxicillin-Pot  Clavulanate Nausea And Vomiting   Cephalosporins Itching   Septra [Sulfamethoxazole-Trimethoprim] Itching        No data to display            CMP     Component Value Date/Time   BUN 18 11/29/2015 1502   CREATININE 0.70 09/19/2021 1535   GFRNONAA 47 (L) 11/29/2015 1502   GFRAA 54 (L) 11/29/2015 1502     No results found.     Assessment & Plan:   1. Lymphedema Recommend:  No surgery or intervention at this point in time.    I have reviewed my previous discussion with the patient regarding swelling and why it  causes symptoms.  The patient is doing well with compression and will continue wearing graduated compression on a daily basis. The patient will  continue wearing the compression first thing in the morning and removing them in the evening. The patient is instructed specifically not to sleep in the compression.    In addition, behavioral modification including elevation during the day and exercise as tolerated will be continued.    Patient should follow-up in 6 months  2. Arthritis Suspect that this may be a driving component of the patient's right lower extremity pain.  Patient advised to continue to follow with PCP for further workup and evaluation.   Current Outpatient Medications on File Prior to Visit  Medication Sig Dispense Refill   Ascorbic Acid POWD Take 2,000 mg by mouth daily. Reported on 11/29/2015 Vitamin C tablets     b complex vitamins capsule Take 1 capsule by mouth daily.     D-Mannose POWD Take by mouth.     Multiple Vitamins-Minerals (CENTRUM SILVER PO) Take by mouth.     VITAMIN D, ERGOCALCIFEROL, PO Take 10,000 mg by mouth daily.     vitamin E 180 MG (400 UNITS) capsule Take 400 Units by mouth daily.     VITAMIN K PO Take 180 mg by mouth daily.     Zinc Sulfate (ZINC 15 PO) Take 10 mg by mouth daily.     DIGEST ENZYMES-ANTICHOLINERGIC PO Take by mouth. (Patient not taking: Reported on 08/02/2022)     Fish Oil-Krill Oil (KRILL OIL PLUS)  CAPS Take by mouth. (Patient not taking: Reported on 06/15/2021)     Lactobacillus ([place] PRO-B PO) Take by mouth. (Patient not taking: Reported on 06/15/2021)     LYSINE PO Take 2 g by mouth in the morning  and at bedtime. (Patient not taking: Reported on 08/02/2022)     TART CHERRY PO Take 2 tablets by mouth daily. 52:1 concentrate for 1200 mg total (Patient not taking: Reported on 08/09/2022)     No current facility-administered medications on file prior to visit.    There are no Patient Instructions on file for this visit. No follow-ups on file.   Kris Hartmann, NP

## 2022-08-12 ENCOUNTER — Encounter (INDEPENDENT_AMBULATORY_CARE_PROVIDER_SITE_OTHER): Payer: Self-pay | Admitting: Nurse Practitioner

## 2022-08-12 NOTE — Progress Notes (Signed)
Subjective:    Patient ID: Alexandra Rios, female    DOB: 30-Apr-1956, 66 y.o.   MRN: [id number] Chief Complaint  Patient presents with   New Patient (Initial Visit)    Consult for left leg pain and edema    Alexandra Rios is a 66 year old female who presents today for evaluation of lower extremity edema from Dr. Doy Hutching.  She denies any pain with rest or activity however it is more painful with touch in her left lower extremity.  It typically occurs with deeper palpation.  The area is also firm.  The patient does note that there is some swelling in the bilateral lower extremities but typically the left is worse than the right.  The patient is active as she has been working out regularly.  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  Currently there are no open wounds or ulcerations.  She does endorse having some calf pain in her right lower extremity but also has a history of sciatica.    Review of Systems  Cardiovascular:  Positive for leg swelling.  All other systems reviewed and are negative.      Objective:   Physical Exam Vitals reviewed.  HENT:     Head: Normocephalic.  Cardiovascular:     Rate and Rhythm: Normal rate.     Pulses:          Dorsalis pedis pulses are 2+ on the right side and 2+ on the left side.       Posterior tibial pulses are 2+ on the right side and 2+ on the left side.  Pulmonary:     Effort: Pulmonary effort is normal.  Musculoskeletal:     Left lower leg: 1+ Edema present.  Skin:    General: Skin is warm and dry.     Comments: Hyper pigmentation left lower extremity  Neurological:     Mental Status: She is alert and oriented to person, place, and time.  Psychiatric:        Mood and Affect: Mood normal.        Behavior: Behavior normal.        Thought Content: Thought content normal.        Judgment: Judgment normal.     BP (!) 149/69 (BP Location: Right Arm)   Pulse 68   Resp 16   Ht '5\' 5"'$  (1.651 m)   Wt 258 lb  (117 kg)   BMI 42.93 kg/m   Past Medical History:  Diagnosis Date   Arthritis    viral   Diverticulitis    Fibroids    GERD (gastroesophageal reflux disease)    Heart burn    Heart murmur    IC (interstitial cystitis)    Recurrent UTI     Social History   Socioeconomic History   Marital status: Married    Spouse name: Not on file   Number of children: Not on file   Years of education: Not on file   Highest education level: Not on file  Occupational History   Not on file  Tobacco Use   Smoking status: Never   Smokeless tobacco: Never  Vaping Use   Vaping Use: Never used  Substance and Sexual Activity   Alcohol use: No    Alcohol/week: 0.0 standard drinks of alcohol   Drug use: No   Sexual activity: Yes  Other Topics Concern   Not on file  Social History Narrative   Not on file  Social Determinants of Health   Financial Resource Strain: Not on file  Food Insecurity: Not on file  Transportation Needs: Not on file  Physical Activity: Not on file  Stress: Not on file  Social Connections: Not on file  Intimate Partner Violence: Not on file    Past Surgical History:  Procedure Laterality Date   BREAST BIOPSY Left 11/2015   breast biopsy  Left 1987   BREAST EXCISIONAL BIOPSY Left 1987   CHOLECYSTECTOMY     COLONOSCOPY WITH PROPOFOL N/A 06/15/2021   Procedure: COLONOSCOPY WITH PROPOFOL;  Surgeon: Annamaria Helling, DO;  Location: [facility];  Service: Gastroenterology;  Laterality: N/A;   DILATION AND CURETTAGE OF UTERUS  2005    Family History  Problem Relation Age of Onset   Bladder Cancer Maternal Uncle    Kidney disease Maternal Uncle    Colon cancer Brother    Lung cancer Brother    Liver cancer Brother    Breast cancer Neg Hx     Allergies  Allergen Reactions   Naproxen Rash    Breaks out with red bumps   Amoxicillin-Pot Clavulanate Nausea And Vomiting   Cephalosporins Itching   Septra [Sulfamethoxazole-Trimethoprim] Itching         No data to display            CMP     Component Value Date/Time   BUN 18 11/29/2015 1502   CREATININE 0.70 09/19/2021 1535   GFRNONAA 47 (L) 11/29/2015 1502   GFRAA 54 (L) 11/29/2015 1502     No results found.     Assessment & Plan:   1. Leg swelling Had a long discussion with the patient regarding leg swelling and the causes.  Patient is advised to utilize medical grade compression stockings.  They should be knee-high.  The typical recommended compression is 20 to 30 mmHg.  She should also elevate her lower extremities when not active.  She should continue with her activity as tolerated.  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  Will have the patient return for bilateral lower extremity venous reflux studies  2. Primary hypertension Continue antihypertensive medications as already ordered, these medications have been reviewed and there are no changes at this time.  3. Chronic bilateral low back pain with right-sided sciatica Patient has strongly palpable pulses, suspect that patient's right lower extremity calf pain is related to her musculoskeletal issues including sciatica.   Current Outpatient Medications on File Prior to Visit  Medication Sig Dispense Refill   Ascorbic Acid POWD Take 2,000 mg by mouth daily. Reported on 11/29/2015 Vitamin C tablets     b complex vitamins capsule Take 1 capsule by mouth daily.     D-Mannose POWD Take by mouth.     TART CHERRY PO Take 2 tablets by mouth daily. 52:1 concentrate for 1200 mg total (Patient not taking: Reported on 08/09/2022)     VITAMIN D, ERGOCALCIFEROL, PO Take 10,000 mg by mouth daily.     vitamin E 180 MG (400 UNITS) capsule Take 400 Units by mouth daily.     VITAMIN K PO Take 180 mg by mouth daily.     Zinc Sulfate (ZINC 15 PO) Take 10 mg by mouth daily.     DIGEST ENZYMES-ANTICHOLINERGIC PO Take by mouth. (Patient not taking: Reported on 08/02/2022)     Fish  Oil-Krill Oil (KRILL OIL PLUS) CAPS Take by mouth. (Patient not taking: Reported on 06/15/2021)  Lactobacillus ([place] PRO-B PO) Take by mouth. (Patient not taking: Reported on 06/15/2021)     LYSINE PO Take 2 g by mouth in the morning and at bedtime. (Patient not taking: Reported on 08/02/2022)     Multiple Vitamins-Minerals (CENTRUM SILVER PO) Take by mouth.     No current facility-administered medications on file prior to visit.    There are no Patient Instructions on file for this visit. No follow-ups on file.   Kris Hartmann, NP

## 2023-02-06 DIAGNOSIS — I890 Lymphedema, not elsewhere classified: Secondary | ICD-10-CM | POA: Insufficient documentation

## 2023-02-06 NOTE — Progress Notes (Signed)
MRN : [id number]  Alexandra Rios is a 67 y.o. ([birth year]) female who presents with chief complaint of legs swell.  History of Present Illness:   The patient returns to the office for followup evaluation regarding leg swelling.  The swelling has improved quite a bit and the pain associated with swelling has decreased substantially. There have not been any interval development of a ulcerations or wounds.  Since the previous visit the patient has been wearing graduated compression stockings and has noted some improvement in the lymphedema. The patient has been using compression routinely morning until night.  The patient also states elevation during the day and exercise (such as walking) is being done too.    Current Meds  Medication Sig   Ascorbic Acid POWD Take 2,000 mg by mouth daily. Reported on 11/29/2015 Vitamin C tablets   b complex vitamins capsule Take 1 capsule by mouth daily.   D-Mannose POWD Take by mouth.   Multiple Vitamins-Minerals (CENTRUM SILVER PO) Take by mouth.   VITAMIN D, ERGOCALCIFEROL, PO Take 10,000 mg by mouth daily.   vitamin E 180 MG (400 UNITS) capsule Take 400 Units by mouth daily.   VITAMIN K PO Take 180 mg by mouth daily.   Zinc Sulfate (ZINC 15 PO) Take 10 mg by mouth daily.    Past Medical History:  Diagnosis Date   Arthritis    viral   Diverticulitis    Fibroids    GERD (gastroesophageal reflux disease)    Heart burn    Heart murmur    IC (interstitial cystitis)    Recurrent UTI     Past Surgical History:  Procedure Laterality Date   BREAST BIOPSY Left 11/2015   breast biopsy  Left 1987   BREAST EXCISIONAL BIOPSY Left 1987   CHOLECYSTECTOMY     COLONOSCOPY WITH PROPOFOL N/A 06/15/2021   Procedure: COLONOSCOPY WITH PROPOFOL;  Surgeon: Jaynie Collins, DO;  Location: [facility] ENDOSCOPY;  Service: Gastroenterology;  Laterality: N/A;   DILATION AND CURETTAGE OF UTERUS  2005    Social History Social  History   Tobacco Use   Smoking status: Never   Smokeless tobacco: Never  Vaping Use   Vaping Use: Never used  Substance Use Topics   Alcohol use: No    Alcohol/week: 0.0 standard drinks of alcohol   Drug use: No    Family History Family History  Problem Relation Age of Onset   Bladder Cancer Maternal Uncle    Kidney disease Maternal Uncle    Colon cancer Brother    Lung cancer Brother    Liver cancer Brother    Breast cancer Neg Hx     Allergies  Allergen Reactions   Naproxen Rash    Breaks out with red bumps   Amoxicillin-Pot Clavulanate Nausea And Vomiting   Avocado Nausea Only   Cephalosporins Itching   Septra [Sulfamethoxazole-Trimethoprim] Itching     REVIEW OF SYSTEMS (Negative unless checked)  Constitutional: [] Weight loss  [] Fever  [] Chills Cardiac: [] Chest pain   [] Chest pressure   [] Palpitations   [] Shortness of breath when laying flat   [] Shortness of breath with exertion. Vascular:  [] Pain in legs with walking   [] Pain in legs with standing  [] History of DVT   [] Phlebitis   [x] Swelling in legs   [] Varicose veins   [] Non-healing ulcers  Pulmonary:   [] Uses home oxygen   [] Productive cough   [] Hemoptysis   [] Wheeze  [] COPD   [] Asthma Neurologic:  [] Dizziness   [] Seizures   [] History of stroke   [] History of TIA  [] Aphasia   [] Vissual changes   [] Weakness or numbness in arm   [] Weakness or numbness in leg Musculoskeletal:   [] Joint swelling   [] Joint pain   [] Low back pain Hematologic:  [] Easy bruising  [] Easy bleeding   [] Hypercoagulable state   [] Anemic Gastrointestinal:  [] Diarrhea   [] Vomiting  [] Gastroesophageal reflux/heartburn   [] Difficulty swallowing. Genitourinary:  [] Chronic kidney disease   [] Difficult urination  [] Frequent urination   [] Blood in urine Skin:  [] Rashes   [] Ulcers  Psychological:  [] History of anxiety   []  History of major depression.  Physical Examination  Vitals:   02/07/23 1140  BP: 136/82  Pulse: 68  Resp: 16  Weight: 265  lb 3.2 oz (120.3 kg)   Body mass index is 44.13 kg/m. Gen: WD/WN, NAD Head: [place]/AT, No temporalis wasting.  Ear/Nose/Throat: Hearing grossly intact, nares w/o erythema or drainage, pinna without lesions Eyes: PER, EOMI, sclera nonicteric.  Neck: Supple, no gross masses.  No JVD.  Pulmonary:  Good air movement, no audible wheezing, no use of accessory muscles.  Cardiac: RRR, precordium not hyperdynamic. Vascular:  scattered varicosities present with right being worst.  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 No guarding/no peritoneal signs.  Musculoskeletal: M/S 5/5 throughout.  No deformity.  Neurologic: CN 2-12 intact. Pain and light touch intact in extremities.  Symmetrical.  Speech is fluent. Motor exam as listed above. Psychiatric: Judgment intact, Mood & affect appropriate for pt's clinical situation. Dermatologic: Venous rashes no ulcers noted.  No changes consistent with cellulitis.   CBC No results found for: "WBC", "HGB", "HCT", "MCV", "PLT"  BMET    Component Value Date/Time   BUN 18 11/29/2015 1502   CREATININE 0.70 09/19/2021 1535   GFRNONAA 47 (L) 11/29/2015 1502   GFRAA 54 (L) 11/29/2015 1502   CrCl cannot be calculated (Patient's most recent lab result is older than the maximum 21 days allowed.).  COAG No results found for: "INR", "PROTIME"  Radiology No results found.   Assessment/Plan 1. Lymphedema Had a long discussion with the patient regarding her symptoms and improvement.  She has definitely made some notable improvement and she is doing very well with adhering to conservative therapies.  We discussed about the use of lymphedema pump over at this time she feels that her swelling is improved and over time it may continue to improve as she focuses on losing weight.  This is reasonable since the patient does not have any open wounds, ulcerations or significant pain or  lower extremity edema.  Will plan on having her back in 6 months to reevaluate.  2. Essential hypertension Continue antihypertensive medications as already ordered, these medications have been reviewed and there are no changes at this time.  3. Morbid obesity with BMI of 40.0-44.9, adult [facility]) Patient has been increasing diet and exercise and this certainly will also help her lower extremity edema    Georgiana Spinner, NP  02/08/2023 1:47 AM

## 2023-02-07 ENCOUNTER — Ambulatory Visit (INDEPENDENT_AMBULATORY_CARE_PROVIDER_SITE_OTHER): Payer: Managed Care, Other (non HMO) | Admitting: Nurse Practitioner

## 2023-02-07 ENCOUNTER — Encounter (INDEPENDENT_AMBULATORY_CARE_PROVIDER_SITE_OTHER): Payer: Self-pay | Admitting: Nurse Practitioner

## 2023-02-07 VITALS — BP 136/82 | HR 68 | Resp 16 | Wt 265.2 lb

## 2023-02-07 DIAGNOSIS — I890 Lymphedema, not elsewhere classified: Secondary | ICD-10-CM | POA: Diagnosis not present

## 2023-02-07 DIAGNOSIS — I10 Essential (primary) hypertension: Secondary | ICD-10-CM

## 2023-02-07 DIAGNOSIS — Z6841 Body Mass Index (BMI) 40.0 and over, adult: Secondary | ICD-10-CM

## 2023-02-07 DIAGNOSIS — M1990 Unspecified osteoarthritis, unspecified site: Secondary | ICD-10-CM

## 2023-02-08 ENCOUNTER — Encounter (INDEPENDENT_AMBULATORY_CARE_PROVIDER_SITE_OTHER): Payer: Self-pay | Admitting: Nurse Practitioner

## 2023-03-04 DIAGNOSIS — K429 Umbilical hernia without obstruction or gangrene: Secondary | ICD-10-CM

## 2023-03-04 HISTORY — DX: Umbilical hernia without obstruction or gangrene: K42.9

## 2023-03-19 ENCOUNTER — Ambulatory Visit: Payer: Self-pay | Admitting: Surgery

## 2023-03-19 NOTE — H&P (View-Only) (Signed)
Subjective:   CC: Umbilical hernia without obstruction and without gangrene [K42.9]  HPI: Alexandra Rios is a 67 y.o. female who returns for evaluation of above. Known hx of umbilical hernia, noted one week ago increased pain in am when getting out of bed, but pain has since improved. No obvious palpable bulge in area.   Past Medical History: has a past medical history of Allergic state, Anemia, Arthritis, Cardiac murmur, Diverticulitis, Eczema, unspecified, Fibrocystic breast disease, GERD (gastroesophageal reflux disease), Hyperlipidemia, Hypertension, Migraine, Obesity, and Recurrent UTI.  Past Surgical History:  Past Surgical History:  Procedure Laterality Date  CHOLECYSTECTOMY 1987  BREAST EXCISIONAL BIOPSY Left 1987  dilation and curettage of uterus 2005  COLONOSCOPY N/A 08/17/2013  Dr. Demetrius Charity. [place] @ [facility] - Diverticulosis, [facility]CC(b)  Left Breast Biopsy Left 11/2015  Benign  COLONOSCOPY 06/15/2021  Tubular adenoma/SSP/FHx CC/Repeat 20yrs/SMR   Family History: family history includes Back Pain in her mother; Bladder Cancer in her maternal uncle; Colon cancer (age of onset: 33) in her brother; Gout in her father; Kidney disease in her maternal uncle; Liver cancer in her brother; Lung cancer in her brother; Skin cancer in her mother.  Social History: reports that she has never smoked. She has never used smokeless tobacco. She reports that she does not drink alcohol and does not use drugs.  Current Medications: has a current medication list which includes the following prescription(s): ascorbic acid (vitamin c), c/sourcherry/celery/grape seed, cholecalciferol, magnesium oxide, vitamin k, quercetin, turmeric/ging/olive/oreg/capry, vitamin b complex, vitamin e, and zinc.  Allergies:  Allergies as of 01/22/2023 - Reviewed 01/22/2023  Allergen Reaction Noted  Naproxen Rash 11/18/2016  Allergen extract-food-avocado Nausea 08/24/2021  Augmentin [amoxicillin-pot clavulanate] Vomiting 11/19/2016   Cephalosporins Itching 08/13/2015  Septra [sulfamethoxazole-trimethoprim] Itching 05/11/2015   ROS:  A 15 point review of systems was performed and pertinent positives and negatives noted in HPI  Objective:    BP (!) 152/84  Pulse 91  Ht 162.6 cm (5\' 4" )  Wt (!) 121 kg (266 lb 12.8 oz)  BMI 45.80 kg/m   Constitutional : Alert, cooperative, no distress  Lymphatics/Throat: Supple, no lymphadenopathy  Respiratory: clear to auscultation bilaterally  Cardiovascular: regular rate and rhythm  Gastrointestinal: soft, non-tender; bowel sounds normal; no masses, no organomegaly. No obvious palpable hernia contents at umbilicus, difficult to assess defect due to body habitus  Musculoskeletal: Steady gait and movement  Skin: Cool and moist, RUQ cholecystectomy surgical scar  Psychiatric: Normal affect, non-agitated, not confused    LABS:  N/a   RADS: CLINICAL DATA: Known umbilical hernia, assess for complication.  Distal pain under right breast.   EXAM:  CT ABDOMEN AND PELVIS WITH CONTRAST   TECHNIQUE:  Multidetector CT imaging of the abdomen and pelvis was performed  using the standard protocol following bolus administration of  intravenous contrast.   RADIATION DOSE REDUCTION: This exam was performed according to the  departmental dose-optimization program which includes automated  exposure control, adjustment of the mA and/or kV according to  patient size and/or use of iterative reconstruction technique.   CONTRAST: OMNIPAQUE IOHEXOL 300 MG/ML SOLN   COMPARISON: MRI August 02, 2020 and CT December 02, 2015   FINDINGS:  Lower chest: No acute abnormality.   Hepatobiliary: Diffuse hepatic steatosis. Relative hyperintense 3 cm  area along the hepatic hilum on image 26/2. Corresponds with a  nodular area of focal fatty sparing seen on prior MRI August 02, 2020. Gallbladder is surgically absent. No biliary ductal dilation.   Pancreas: No pancreatic  ductal dilation  or evidence of acute  inflammation.   Spleen: Normal in size without focal abnormality.   Adrenals/Urinary Tract: Bilateral adrenal glands are unremarkable.  No hydronephrosis. Kidneys demonstrate symmetric enhancement and  excretion of contrast material. Urinary bladder is underdistended  limiting evaluation.   Stomach/Bowel: Radiopaque enteric contrast material traverses the  sigmoid colon. Small hiatal hernia otherwise the stomach is  unremarkable for degree of distension. No pathologic dilation of  small or large bowel. The appendix and terminal ileum appear normal.  Colonic diverticulosis without findings of acute diverticulitis.   Vascular/Lymphatic: Scattered aortic atherosclerosis without  aneurysmal dilation. No pathologically enlarged abdominal or pelvic  lymph nodes.   Reproductive: Uterus and bilateral adnexa are unremarkable.   Other: Inflammatory stranding about a small fat containing  periumbilical hernia which has a 7 mm aperture width.   Musculoskeletal: Similar mild thoracolumbar spondylosis with  associated degenerative change and lateral compression deformity of  the T12 vertebral body. No acute osseous abnormality.   IMPRESSION:  1. Inflammatory stranding about a small fat containing periumbilical  hernia which has a 7 mm aperture. Recommend clinical correlation for  reducibility.  2. Colonic diverticulosis without findings of acute diverticulitis.  3. Diffuse hepatic steatosis, with an ill-defined hyperintense area  along the hepatic hilum which corresponds with an area of focal  fatty sparing on prior MRI August 02, 2020.  4. Small hiatal hernia.  5. Aortic Atherosclerosis (ICD10-I70.0).   Electronically Signed  By: Maudry Mayhew M.D.  On: 09/19/2021 17:15  Assessment:    Umbilical hernia without obstruction and without gangrene [K42.9]  Plan:    1. Umbilical hernia without obstruction and without gangrene [K42.9]  Discussed pathophysiology  of hernia and r/b/a to surgery. Pt opted to continue monitoring for now. Currently trying to lose weight, which will decrease perioperative risk and also decrease recurrent rate in future. No concern of incisional hernia noted on exam today in RUQ. F/u prn.  UPDATE:  Pt called 03/05/23 wanting to set up surgery. She weighs 266 pounds. She was wanting to know if she needed to be a certain weight before having the surgery. I asked Dr. Tonna Boehringer and he said ideally the pt's weight should be below 200 pounds because pt's with BMI <34 have consistently lower complication rates compared to someone with a higher BMI. I told the pt this and she said she still wants to have the surgery.   labs/images/medications/previous chart entries reviewed personally and relevant changes/updates noted above.

## 2023-03-19 NOTE — H&P (Signed)
Subjective:   CC: Umbilical hernia without obstruction and without gangrene [K42.9]  HPI: Alexandra Rios is a 66 y.o. female who returns for evaluation of above. Known hx of umbilical hernia, noted one week ago increased pain in am when getting out of bed, but pain has since improved. No obvious palpable bulge in area.   Past Medical History: has a past medical history of Allergic state, Anemia, Arthritis, Cardiac murmur, Diverticulitis, Eczema, unspecified, Fibrocystic breast disease, GERD (gastroesophageal reflux disease), Hyperlipidemia, Hypertension, Migraine, Obesity, and Recurrent UTI.  Past Surgical History:  Past Surgical History:  Procedure Laterality Date  CHOLECYSTECTOMY 1987  BREAST EXCISIONAL BIOPSY Left 1987  dilation and curettage of uterus 2005  COLONOSCOPY N/A 08/17/2013  Dr. Demetrius Charity. [place] @ [facility] - Diverticulosis, FHCC(b)  Left Breast Biopsy Left 11/2015  Benign  COLONOSCOPY 06/15/2021  Tubular adenoma/SSP/FHx CC/Repeat 20yrs/SMR   Family History: family history includes Back Pain in her mother; Bladder Cancer in her maternal uncle; Colon cancer (age of onset: 33) in her brother; Gout in her father; Kidney disease in her maternal uncle; Liver cancer in her brother; Lung cancer in her brother; Skin cancer in her mother.  Social History: reports that she has never smoked. She has never used smokeless tobacco. She reports that she does not drink alcohol and does not use drugs.  Current Medications: has a current medication list which includes the following prescription(s): ascorbic acid (vitamin c), c/sourcherry/celery/grape seed, cholecalciferol, magnesium oxide, vitamin k, quercetin, turmeric/ging/olive/oreg/capry, vitamin b complex, vitamin e, and zinc.  Allergies:  Allergies as of 01/22/2023 - Reviewed 01/22/2023  Allergen Reaction Noted  Naproxen Rash 11/18/2016  Allergen extract-food-avocado Nausea 08/24/2021  Augmentin [amoxicillin-pot clavulanate] Vomiting 11/19/2016   Cephalosporins Itching 08/13/2015  Septra [sulfamethoxazole-trimethoprim] Itching 05/11/2015   ROS:  A 15 point review of systems was performed and pertinent positives and negatives noted in HPI  Objective:    BP (!) 152/84  Pulse 91  Ht 162.6 cm (5\' 4" )  Wt (!) 121 kg (266 lb 12.8 oz)  BMI 45.80 kg/m   Constitutional : Alert, cooperative, no distress  Lymphatics/Throat: Supple, no lymphadenopathy  Respiratory: clear to auscultation bilaterally  Cardiovascular: regular rate and rhythm  Gastrointestinal: soft, non-tender; bowel sounds normal; no masses, no organomegaly. No obvious palpable hernia contents at umbilicus, difficult to assess defect due to body habitus  Musculoskeletal: Steady gait and movement  Skin: Cool and moist, RUQ cholecystectomy surgical scar  Psychiatric: Normal affect, non-agitated, not confused    LABS:  N/a   RADS: CLINICAL DATA: Known umbilical hernia, assess for complication.  Distal pain under right breast.   EXAM:  CT ABDOMEN AND PELVIS WITH CONTRAST   TECHNIQUE:  Multidetector CT imaging of the abdomen and pelvis was performed  using the standard protocol following bolus administration of  intravenous contrast.   RADIATION DOSE REDUCTION: This exam was performed according to the  departmental dose-optimization program which includes automated  exposure control, adjustment of the mA and/or kV according to  patient size and/or use of iterative reconstruction technique.   CONTRAST: OMNIPAQUE IOHEXOL 300 MG/ML SOLN   COMPARISON: MRI August 02, 2020 and CT December 02, 2015   FINDINGS:  Lower chest: No acute abnormality.   Hepatobiliary: Diffuse hepatic steatosis. Relative hyperintense 3 cm  area along the hepatic hilum on image 26/2. Corresponds with a  nodular area of focal fatty sparing seen on prior MRI August 02, 2020. Gallbladder is surgically absent. No biliary ductal dilation.   Pancreas: No pancreatic  ductal dilation  or evidence of acute  inflammation.   Spleen: Normal in size without focal abnormality.   Adrenals/Urinary Tract: Bilateral adrenal glands are unremarkable.  No hydronephrosis. Kidneys demonstrate symmetric enhancement and  excretion of contrast material. Urinary bladder is underdistended  limiting evaluation.   Stomach/Bowel: Radiopaque enteric contrast material traverses the  sigmoid colon. Small hiatal hernia otherwise the stomach is  unremarkable for degree of distension. No pathologic dilation of  small or large bowel. The appendix and terminal ileum appear normal.  Colonic diverticulosis without findings of acute diverticulitis.   Vascular/Lymphatic: Scattered aortic atherosclerosis without  aneurysmal dilation. No pathologically enlarged abdominal or pelvic  lymph nodes.   Reproductive: Uterus and bilateral adnexa are unremarkable.   Other: Inflammatory stranding about a small fat containing  periumbilical hernia which has a 7 mm aperture width.   Musculoskeletal: Similar mild thoracolumbar spondylosis with  associated degenerative change and lateral compression deformity of  the T12 vertebral body. No acute osseous abnormality.   IMPRESSION:  1. Inflammatory stranding about a small fat containing periumbilical  hernia which has a 7 mm aperture. Recommend clinical correlation for  reducibility.  2. Colonic diverticulosis without findings of acute diverticulitis.  3. Diffuse hepatic steatosis, with an ill-defined hyperintense area  along the hepatic hilum which corresponds with an area of focal  fatty sparing on prior MRI August 02, 2020.  4. Small hiatal hernia.  5. Aortic Atherosclerosis (ICD10-I70.0).   Electronically Signed  By: Maudry Mayhew M.D.  On: 09/19/2021 17:15  Assessment:    Umbilical hernia without obstruction and without gangrene [K42.9]  Plan:    1. Umbilical hernia without obstruction and without gangrene [K42.9]  Discussed pathophysiology  of hernia and r/b/a to surgery. Pt opted to continue monitoring for now. Currently trying to lose weight, which will decrease perioperative risk and also decrease recurrent rate in future. No concern of incisional hernia noted on exam today in RUQ. F/u prn.  UPDATE:  Pt called 03/05/23 wanting to set up surgery. She weighs 266 pounds. She was wanting to know if she needed to be a certain weight before having the surgery. I asked Dr. Tonna Boehringer and he said ideally the pt's weight should be below 200 pounds because pt's with BMI <34 have consistently lower complication rates compared to someone with a higher BMI. I told the pt this and she said she still wants to have the surgery.   labs/images/medications/previous chart entries reviewed personally and relevant changes/updates noted above.

## 2023-03-20 ENCOUNTER — Encounter
Admission: RE | Admit: 2023-03-20 | Discharge: 2023-03-20 | Disposition: A | Discharge status: Home / Self Care | Payer: Managed Care, Other (non HMO) | Source: Ambulatory Visit | Attending: Surgery | Admitting: Surgery

## 2023-03-20 VITALS — Ht 64.75 in | Wt 250.0 lb

## 2023-03-20 DIAGNOSIS — I10 Essential (primary) hypertension: Secondary | ICD-10-CM | POA: Diagnosis not present

## 2023-03-20 DIAGNOSIS — Z6841 Body Mass Index (BMI) 40.0 and over, adult: Secondary | ICD-10-CM | POA: Diagnosis not present

## 2023-03-20 DIAGNOSIS — Z01810 Encounter for preprocedural cardiovascular examination: Secondary | ICD-10-CM | POA: Diagnosis not present

## 2023-03-20 DIAGNOSIS — Z01818 Encounter for other preprocedural examination: Secondary | ICD-10-CM | POA: Insufficient documentation

## 2023-03-20 DIAGNOSIS — Z01812 Encounter for preprocedural laboratory examination: Secondary | ICD-10-CM

## 2023-03-20 HISTORY — DX: Lymphedema, not elsewhere classified: I89.0

## 2023-03-20 HISTORY — DX: Diverticulosis of intestine, part unspecified, without perforation or abscess without bleeding: K57.90

## 2023-03-20 HISTORY — DX: Pure hypercholesterolemia, unspecified: E78.00

## 2023-03-20 HISTORY — DX: Morbid (severe) obesity due to excess calories: E66.01

## 2023-03-20 HISTORY — DX: Essential (primary) hypertension: I10

## 2023-03-20 HISTORY — DX: Anemia, unspecified: D64.9

## 2023-03-20 HISTORY — DX: Diffuse cystic mastopathy of unspecified breast: N60.19

## 2023-03-20 HISTORY — DX: Vitamin D deficiency, unspecified: E55.9

## 2023-03-20 HISTORY — DX: Hyperlipidemia, unspecified: E78.5

## 2023-03-20 LAB — CBC
HCT: 39.1 % (ref 36.0–46.0)
Hemoglobin: 12.8 g/dL (ref 12.0–15.0)
MCH: 29.9 pg (ref 26.0–34.0)
MCHC: 32.7 g/dL (ref 30.0–36.0)
MCV: 91.4 fL (ref 80.0–100.0)
Platelets: 274 10*3/uL (ref 150–400)
RBC: 4.28 MIL/uL (ref 3.87–5.11)
RDW: 12.8 % (ref 11.5–15.5)
WBC: 8 10*3/uL (ref 4.0–10.5)
nRBC: 0 % (ref 0.0–0.2)

## 2023-03-20 LAB — BASIC METABOLIC PANEL
Anion gap: 8 (ref 5–15)
BUN: 21 mg/dL (ref 8–23)
CO2: 25 mmol/L (ref 22–32)
Calcium: 9.3 mg/dL (ref 8.9–10.3)
Chloride: 106 mmol/L (ref 98–111)
Creatinine, Ser: 0.69 mg/dL (ref 0.44–1.00)
GFR, Estimated: >60 mL/min (ref >60)
Glucose, Bld: 97 mg/dL (ref 70–99)
Potassium: 4.5 mmol/L (ref 3.5–5.1)
Sodium: 139 mmol/L (ref 135–145)

## 2023-03-20 NOTE — Patient Instructions (Addendum)
Your procedure is scheduled on: Friday, July 26 Report to the Registration Desk on the 1st floor of the [facility] To find out your arrival time, please call [phone] between 1PM - 3PM on: Thursday, July 25 If your arrival time is 6:00 am, do not arrive before that time as the [facility] entrance doors do not open until 6:00 am.  REMEMBER: Instructions that are not followed completely may result in serious medical risk, up to and including death; or upon the discretion of your surgeon and anesthesiologist your surgery may need to be rescheduled.  Do not eat food after midnight the night before surgery.  No gum chewing or hard candies.  You may however, drink CLEAR liquids up to 2 hours before you are scheduled to arrive for your surgery. Do not drink anything within 2 hours of your scheduled arrival time.  Clear liquids include: - water  - apple juice without pulp - gatorade (not RED colors) - black coffee or tea (Do NOT add milk or creamers to the coffee or tea) Do NOT drink anything that is not on this list.  One week prior to surgery: starting July 19 Stop Anti-inflammatories (NSAIDS) such as Advil, Aleve, Ibuprofen, Motrin, Naproxen, Naprosyn and Aspirin based products such as Excedrin, Goody's Powder, BC Powder. Stop ANY and ALL OVER THE COUNTER supplements until after surgery. You may however, continue to take Tylenol if needed for pain up until the day of surgery.  DO NOT TAKE ANY MEDICATIONS THE MORNING OF SURGERY   No Alcohol for 24 hours before or after surgery.  No Smoking including e-cigarettes for 24 hours before surgery.  No chewable tobacco products for at least 6 hours before surgery.  No nicotine patches on the day of surgery.  Do not use any "recreational" drugs for at least a week (preferably 2 weeks) before your surgery.  Please be advised that the combination of cocaine and anesthesia may have negative outcomes, up to and including death. If you test  positive for cocaine, your surgery will be cancelled.  On the morning of surgery brush your teeth with toothpaste and water, you may rinse your mouth with mouthwash if you wish. Do not swallow any toothpaste or mouthwash.  Use CHG Soap as directed on instruction sheet.  Do not wear jewelry, make-up, hairpins, clips or nail polish.  Do not wear lotions, powders, or perfumes.   Do not shave body hair from the neck down 48 hours before surgery.  Contact lenses, hearing aids and dentures may not be worn into surgery.  Do not bring valuables to the hospital. [facility] is not responsible for any missing/lost belongings or valuables.   Notify your doctor if there is any change in your medical condition (cold, fever, infection).  Wear comfortable clothing (specific to your surgery type) to the hospital.  After surgery, you can help prevent lung complications by doing breathing exercises.  Take deep breaths and cough every 1-2 hours. Your doctor may order a device called an Incentive Spirometer to help you take deep breaths. When coughing or sneezing, hold a pillow firmly against your incision with both hands. This is called "splinting." Doing this helps protect your incision. It also decreases belly discomfort.  If you are being discharged the day of surgery, you will not be allowed to drive home. You will need a responsible individual to drive you home and stay with you for 24 hours after surgery.   If you are taking public transportation, you  will need to have a responsible individual with you.  Please call the Pre-admissions Testing Dept. at [phone] if you have any questions about these instructions.  Surgery Visitation Policy:  Patients having surgery or a procedure may have two visitors.  Children under the age of 13 must have an adult with them who is not the patient.     Preparing for Surgery with CHLORHEXIDINE GLUCONATE (CHG) Soap  Chlorhexidine Gluconate (CHG)  Soap  o An antiseptic cleaner that kills germs and bonds with the skin to continue killing germs even after washing  o Used for showering the night before surgery and morning of surgery  Before surgery, you can play an important role by reducing the number of germs on your skin.  CHG (Chlorhexidine gluconate) soap is an antiseptic cleanser which kills germs and bonds with the skin to continue killing germs even after washing.  Please do not use if you have an allergy to CHG or antibacterial soaps. If your skin becomes reddened/irritated stop using the CHG.  1. Shower the NIGHT BEFORE SURGERY and the MORNING OF SURGERY with CHG soap.  2. If you choose to wash your hair, wash your hair first as usual with your normal shampoo.  3. After shampooing, rinse your hair and body thoroughly to remove the shampoo.  4. Use CHG as you would any other liquid soap. You can apply CHG directly to the skin and wash gently with a scrungie or a clean washcloth.  5. Apply the CHG soap to your body only from the neck down. Do not use on open wounds or open sores. Avoid contact with your eyes, ears, mouth, and genitals (private parts). Wash face and genitals (private parts) with your normal soap.  6. Wash thoroughly, paying special attention to the area where your surgery will be performed.  7. Thoroughly rinse your body with warm water.  8. Do not shower/wash with your normal soap after using and rinsing off the CHG soap.  9. Pat yourself dry with a clean towel.  10. Wear clean pajamas to bed the night before surgery.  12. Place clean sheets on your bed the night of your first shower and do not sleep with pets.  13. Shower again with the CHG soap on the day of surgery prior to arriving at the hospital.  14. Do not apply any deodorants/lotions/powders.  15. Please wear clean clothes to the hospital.

## 2023-03-29 ENCOUNTER — Ambulatory Visit: Payer: Managed Care, Other (non HMO) | Admitting: Urgent Care

## 2023-03-29 ENCOUNTER — Other Ambulatory Visit: Payer: Self-pay

## 2023-03-29 ENCOUNTER — Encounter
Admission: RE | Disposition: A | Discharge status: Home / Self Care | Payer: Self-pay | Source: Home / Self Care | Attending: Surgery

## 2023-03-29 ENCOUNTER — Encounter: Payer: Self-pay | Admitting: Surgery

## 2023-03-29 ENCOUNTER — Ambulatory Visit: Payer: Managed Care, Other (non HMO) | Admitting: Certified Registered"

## 2023-03-29 ENCOUNTER — Ambulatory Visit
Admission: RE | Admit: 2023-03-29 | Discharge: 2023-03-29 | Disposition: A | Discharge status: Home / Self Care | Payer: Managed Care, Other (non HMO) | Attending: Surgery | Admitting: Surgery

## 2023-03-29 DIAGNOSIS — I700 Atherosclerosis of aorta: Secondary | ICD-10-CM | POA: Insufficient documentation

## 2023-03-29 DIAGNOSIS — Z79899 Other long term (current) drug therapy: Secondary | ICD-10-CM | POA: Diagnosis not present

## 2023-03-29 DIAGNOSIS — Z6841 Body Mass Index (BMI) 40.0 and over, adult: Secondary | ICD-10-CM | POA: Diagnosis not present

## 2023-03-29 DIAGNOSIS — K429 Umbilical hernia without obstruction or gangrene: Secondary | ICD-10-CM | POA: Insufficient documentation

## 2023-03-29 DIAGNOSIS — K420 Umbilical hernia with obstruction, without gangrene: Secondary | ICD-10-CM

## 2023-03-29 DIAGNOSIS — E785 Hyperlipidemia, unspecified: Secondary | ICD-10-CM | POA: Diagnosis not present

## 2023-03-29 DIAGNOSIS — K760 Fatty (change of) liver, not elsewhere classified: Secondary | ICD-10-CM | POA: Insufficient documentation

## 2023-03-29 DIAGNOSIS — Z9049 Acquired absence of other specified parts of digestive tract: Secondary | ICD-10-CM | POA: Insufficient documentation

## 2023-03-29 DIAGNOSIS — K449 Diaphragmatic hernia without obstruction or gangrene: Secondary | ICD-10-CM | POA: Diagnosis not present

## 2023-03-29 DIAGNOSIS — K5730 Diverticulosis of large intestine without perforation or abscess without bleeding: Secondary | ICD-10-CM | POA: Insufficient documentation

## 2023-03-29 DIAGNOSIS — Z9889 Other specified postprocedural states: Secondary | ICD-10-CM | POA: Diagnosis not present

## 2023-03-29 DIAGNOSIS — K219 Gastro-esophageal reflux disease without esophagitis: Secondary | ICD-10-CM | POA: Diagnosis not present

## 2023-03-29 DIAGNOSIS — I10 Essential (primary) hypertension: Secondary | ICD-10-CM | POA: Insufficient documentation

## 2023-03-29 HISTORY — PX: INSERTION OF MESH: SHX5868

## 2023-03-29 SURGERY — REPAIR, HERNIA, UMBILICAL, ROBOT-ASSISTED
Anesthesia: General | Site: Abdomen

## 2023-03-29 MED ORDER — ROCURONIUM BROMIDE 100 MG/10ML IV SOLN
INTRAVENOUS | Status: DC | PRN
  Administered 2023-03-29: 50 mg via INTRAVENOUS

## 2023-03-29 MED ORDER — MIDAZOLAM HCL 2 MG/2ML IJ SOLN
INTRAMUSCULAR | Status: AC
  Filled 2023-03-29: qty 2

## 2023-03-29 MED ORDER — ACETAMINOPHEN 325 MG PO TABS: 650.0000 mg | ORAL_TABLET | Freq: Three times a day (TID) | ORAL | 0 refills | Status: DC | PRN

## 2023-03-29 MED ORDER — OXYCODONE HCL 5 MG/5ML PO SOLN: 5.0000 mg | Freq: Once | ORAL | Status: DC | PRN

## 2023-03-29 MED ORDER — DEXMEDETOMIDINE HCL IN NACL 200 MCG/50ML IV SOLN
INTRAVENOUS | Status: DC | PRN
  Administered 2023-03-29: 8 ug via INTRAVENOUS
  Administered 2023-03-29: 12 ug via INTRAVENOUS

## 2023-03-29 MED ORDER — ONDANSETRON HCL 4 MG/2ML IJ SOLN
INTRAMUSCULAR | Status: DC | PRN
  Administered 2023-03-29 (×2): 4 mg via INTRAVENOUS

## 2023-03-29 MED ORDER — SUGAMMADEX SODIUM 200 MG/2ML IV SOLN
INTRAVENOUS | Status: DC | PRN
  Administered 2023-03-29: 200 mg via INTRAVENOUS

## 2023-03-29 MED ORDER — HYDROMORPHONE HCL 1 MG/ML IJ SOLN
INTRAMUSCULAR | Status: DC | PRN
Start: 2023-03-29 — End: 2023-03-29
  Administered 2023-03-29: 1 mg via INTRAVENOUS

## 2023-03-29 MED ORDER — HYDROMORPHONE HCL 1 MG/ML IJ SOLN
INTRAMUSCULAR | Status: AC
  Filled 2023-03-29: qty 1

## 2023-03-29 MED ORDER — SODIUM CHLORIDE 0.9 % IV SOLN
INTRAVENOUS | Status: DC | PRN
  Administered 2023-03-29: 50 mL

## 2023-03-29 MED ORDER — LIDOCAINE HCL (CARDIAC) PF 100 MG/5ML IV SOSY
PREFILLED_SYRINGE | INTRAVENOUS | Status: DC | PRN
  Administered 2023-03-29: 100 mg via INTRAVENOUS

## 2023-03-29 MED ORDER — LACTATED RINGERS IV SOLN: INTRAVENOUS | Status: DC

## 2023-03-29 MED ORDER — FENTANYL CITRATE (PF) 100 MCG/2ML IJ SOLN
INTRAMUSCULAR | Status: DC | PRN
  Administered 2023-03-29 (×2): 50 ug via INTRAVENOUS

## 2023-03-29 MED ORDER — FAMOTIDINE 20 MG PO TABS
ORAL_TABLET | ORAL | Status: AC
  Filled 2023-03-29: qty 1

## 2023-03-29 MED ORDER — BUPIVACAINE LIPOSOME 1.3 % IJ SUSP
INTRAMUSCULAR | Status: AC
  Filled 2023-03-29: qty 20

## 2023-03-29 MED ORDER — ACETAMINOPHEN 500 MG PO TABS
ORAL_TABLET | ORAL | Status: AC
  Filled 2023-03-29: qty 2

## 2023-03-29 MED ORDER — FAMOTIDINE 20 MG PO TABS
20.0000 mg | ORAL_TABLET | Freq: Once | ORAL | Status: AC
  Administered 2023-03-29: 20 mg via ORAL

## 2023-03-29 MED ORDER — DEXAMETHASONE SODIUM PHOSPHATE 10 MG/ML IJ SOLN
INTRAMUSCULAR | Status: DC | PRN
  Administered 2023-03-29: 10 mg via INTRAVENOUS

## 2023-03-29 MED ORDER — DOCUSATE SODIUM 100 MG PO CAPS: 100.0000 mg | ORAL_CAPSULE | Freq: Two times a day (BID) | ORAL | 0 refills | Status: AC | PRN

## 2023-03-29 MED ORDER — PHENYLEPHRINE 80 MCG/ML (10ML) SYRINGE FOR IV PUSH (FOR BLOOD PRESSURE SUPPORT)
PREFILLED_SYRINGE | INTRAVENOUS | Status: DC | PRN
  Administered 2023-03-29: 160 ug via INTRAVENOUS

## 2023-03-29 MED ORDER — CHLORHEXIDINE GLUCONATE CLOTH 2 % EX PADS
6.0000 | MEDICATED_PAD | Freq: Once | CUTANEOUS | Status: AC
  Administered 2023-03-29: 6 via TOPICAL

## 2023-03-29 MED ORDER — DROPERIDOL 2.5 MG/ML IJ SOLN
0.6250 mg | Freq: Once | INTRAMUSCULAR | Status: AC
  Administered 2023-03-29: 0.625 mg via INTRAVENOUS

## 2023-03-29 MED ORDER — BUPIVACAINE HCL (PF) 0.5 % IJ SOLN
INTRAMUSCULAR | Status: AC
  Filled 2023-03-29: qty 30

## 2023-03-29 MED ORDER — GABAPENTIN 300 MG PO CAPS
300.0000 mg | ORAL_CAPSULE | ORAL | Status: AC
  Administered 2023-03-29: 300 mg via ORAL

## 2023-03-29 MED ORDER — FENTANYL CITRATE (PF) 100 MCG/2ML IJ SOLN: 25.0000 ug | INTRAMUSCULAR | Status: DC | PRN

## 2023-03-29 MED ORDER — GABAPENTIN 300 MG PO CAPS
ORAL_CAPSULE | ORAL | Status: AC
  Filled 2023-03-29: qty 1

## 2023-03-29 MED ORDER — BUPIVACAINE-EPINEPHRINE 0.5% -1:200000 IJ SOLN
INTRAMUSCULAR | Status: DC | PRN
  Administered 2023-03-29: 30 mL

## 2023-03-29 MED ORDER — CHLORHEXIDINE GLUCONATE 0.12 % MT SOLN
15.0000 mL | Freq: Once | OROMUCOSAL | Status: AC
  Administered 2023-03-29: 15 mL via OROMUCOSAL

## 2023-03-29 MED ORDER — FENTANYL CITRATE (PF) 100 MCG/2ML IJ SOLN
INTRAMUSCULAR | Status: AC
  Filled 2023-03-29: qty 2

## 2023-03-29 MED ORDER — GLYCOPYRROLATE 0.2 MG/ML IJ SOLN
INTRAMUSCULAR | Status: DC | PRN
  Administered 2023-03-29: .2 mg via INTRAVENOUS

## 2023-03-29 MED ORDER — DROPERIDOL 2.5 MG/ML IJ SOLN
INTRAMUSCULAR | Status: AC
  Filled 2023-03-29: qty 2

## 2023-03-29 MED ORDER — CHLORHEXIDINE GLUCONATE 0.12 % MT SOLN
OROMUCOSAL | Status: AC
  Filled 2023-03-29: qty 15

## 2023-03-29 MED ORDER — MIDAZOLAM HCL 2 MG/2ML IJ SOLN
INTRAMUSCULAR | Status: DC | PRN
  Administered 2023-03-29: 2 mg via INTRAVENOUS

## 2023-03-29 MED ORDER — CEFAZOLIN IN SODIUM CHLORIDE 3-0.9 GM/100ML-% IV SOLN
3.0000 g | INTRAVENOUS | Status: AC
  Administered 2023-03-29: 3 g via INTRAVENOUS
  Filled 2023-03-29: qty 100

## 2023-03-29 MED ORDER — ACETAMINOPHEN 500 MG PO TABS
1000.0000 mg | ORAL_TABLET | ORAL | Status: AC
  Administered 2023-03-29: 1000 mg via ORAL

## 2023-03-29 MED ORDER — PROPOFOL 10 MG/ML IV BOLUS
INTRAVENOUS | Status: DC | PRN
  Administered 2023-03-29: 160 mg via INTRAVENOUS

## 2023-03-29 MED ORDER — EPINEPHRINE PF 1 MG/ML IJ SOLN
INTRAMUSCULAR | Status: AC
  Filled 2023-03-29: qty 1

## 2023-03-29 MED ORDER — ORAL CARE MOUTH RINSE: 15.0000 mL | Freq: Once | OROMUCOSAL | Status: AC

## 2023-03-29 MED ORDER — HYDROCODONE-ACETAMINOPHEN 5-325 MG PO TABS: 1.0000 | ORAL_TABLET | Freq: Four times a day (QID) | ORAL | 0 refills | Status: DC | PRN

## 2023-03-29 MED ORDER — OXYCODONE HCL 5 MG PO TABS: 5.0000 mg | ORAL_TABLET | Freq: Once | ORAL | Status: DC | PRN

## 2023-03-29 SURGICAL SUPPLY — 53 items
ADH SKN CLS APL DERMABOND .7 (GAUZE/BANDAGES/DRESSINGS) ×2
BLADE SURG SZ11 CARB STEEL (BLADE) ×2 IMPLANT
COVER TIP SHEARS 8 DVNC (MISCELLANEOUS) ×2 IMPLANT
COVER WAND RF STERILE (DRAPES) ×2 IMPLANT
DEFOGGER SCOPE WARMER CLEARIFY (MISCELLANEOUS) IMPLANT
DERMABOND ADVANCED .7 DNX12 (GAUZE/BANDAGES/DRESSINGS) ×2 IMPLANT
DRAPE ARM DVNC X/XI (DISPOSABLE) ×6 IMPLANT
DRAPE COLUMN DVNC XI (DISPOSABLE) ×2 IMPLANT
ELECT CAUTERY BLADE 6.4 (BLADE) ×2 IMPLANT
ELECT REM PT RETURN 9FT ADLT (ELECTROSURGICAL) ×2
ELECTRODE REM PT RTRN 9FT ADLT (ELECTROSURGICAL) ×2 IMPLANT
FORCEPS BPLR FENES DVNC XI (FORCEP) ×2 IMPLANT
GLOVE BIOGEL PI IND STRL 7.0 (GLOVE) ×4 IMPLANT
GLOVE SURG SYN 6.5 ES PF (GLOVE) ×10 IMPLANT
GLOVE SURG SYN 6.5 PF PI (GLOVE) ×4 IMPLANT
GOWN STRL REUS W/ TWL LRG LVL3 (GOWN DISPOSABLE) ×6 IMPLANT
GOWN STRL REUS W/TWL LRG LVL3 (GOWN DISPOSABLE) ×10
GRASPER SUT TROCAR 14GX15 (MISCELLANEOUS) IMPLANT
IRRIGATOR SUCT 8 DISP DVNC XI (IRRIGATION / IRRIGATOR) IMPLANT
IV NS 1000ML (IV SOLUTION)
IV NS 1000ML BAXH (IV SOLUTION) IMPLANT
LABEL OR SOLS (LABEL) ×2 IMPLANT
MANIFOLD NEPTUNE II (INSTRUMENTS) ×2 IMPLANT
MESH PROGRIP HERNIA FLAT 30X15 (Mesh General) IMPLANT
NDL DRIVE SUT CUT DVNC (INSTRUMENTS) ×2 IMPLANT
NDL HYPO 22X1.5 SAFETY MO (MISCELLANEOUS) ×2 IMPLANT
NDL INSUFFLATION 14GA 120MM (NEEDLE) ×2 IMPLANT
NEEDLE DRIVE SUT CUT DVNC (INSTRUMENTS) ×2 IMPLANT
NEEDLE HYPO 22X1.5 SAFETY MO (MISCELLANEOUS) ×2 IMPLANT
NEEDLE INSUFFLATION 14GA 120MM (NEEDLE) ×2 IMPLANT
OBTURATOR OPTICAL STND 8 DVNC (TROCAR) ×2
OBTURATOR OPTICALSTD 8 DVNC (TROCAR) ×2 IMPLANT
PACK LAP CHOLECYSTECTOMY (MISCELLANEOUS) ×2 IMPLANT
PENCIL SMOKE EVACUATOR (MISCELLANEOUS) ×2 IMPLANT
SCISSORS MNPLR CVD DVNC XI (INSTRUMENTS) ×2 IMPLANT
SEAL UNIV 5-12 XI (MISCELLANEOUS) ×6 IMPLANT
SET TUBE SMOKE EVAC HIGH FLOW (TUBING) ×2 IMPLANT
SOL ELECTROSURG ANTI STICK (MISCELLANEOUS) ×2
SOLUTION ELECTROSURG ANTI STCK (MISCELLANEOUS) ×2 IMPLANT
SUT MNCRL AB 4-0 PS2 18 (SUTURE) ×2 IMPLANT
SUT STRATAFIX 0 PDS+ CT-2 23 (SUTURE) ×2
SUT V-LOC 90 ABS DVC 3-0 CL (SUTURE) ×4 IMPLANT
SUT VIC AB 3-0 SH 27 (SUTURE) ×2
SUT VIC AB 3-0 SH 27X BRD (SUTURE) ×2 IMPLANT
SUT VICRYL 0 UR6 27IN ABS (SUTURE) ×2 IMPLANT
SUTURE STRATFX 0 PDS+ CT-2 23 (SUTURE) ×2 IMPLANT
SYR 30ML LL (SYRINGE) ×2 IMPLANT
SYSTEM WECK SHIELD CLOSURE (TROCAR) IMPLANT
TRAP FLUID SMOKE EVACUATOR (MISCELLANEOUS) ×2 IMPLANT
TRAY FOLEY MTR SLVR 16FR STAT (SET/KITS/TRAYS/PACK) ×2 IMPLANT
TROCAR Z THREAD OPTICAL 5X150 (TROCAR) IMPLANT
TROCAR Z-THREAD FIOS 5X100MM (TROCAR) IMPLANT
WATER STERILE IRR 500ML POUR (IV SOLUTION) ×2 IMPLANT

## 2023-03-29 NOTE — Op Note (Signed)
Preoperative diagnosis: umbilical, initial, reducible hernia Postoperative diagnosis: incarcerated umbilical, initial hernia  Procedure: Robotic assisted laparoscopic incarcerated umbilical  hernia repair with mesh  Anesthesia: general  Surgeon: Sung Amabile  Wound Classification: Clean  Specimen: none  Complications: None  Estimated Blood Loss: 10ml  Indications:see HPI  Findings: umbilical hernia defect measuring 1cm x 1.3cm 4. Tension free repair achieved with ProGrip mesh and suture 5. Adequate hemostasis  Description of procedure: The patient was brought to the operating room and general anesthesia was induced. A time-out was completed verifying correct patient, procedure, site, positioning, and implant(s) and/or special equipment prior to beginning this procedure. Antibiotics were administered prior to making the incision. SCDs placed. The anterior abdominal wall was prepped and draped in the standard sterile fashion.   Palmer's point chosen for entry.  Veress needle placed and abdomen insufflated to 15cm without any dramatic increase in pressure.  Needle removed and optiview technique used to place 5mm port at same point.  No injury noted during placement. 3 additional ports, 8mm x2 and 12mm, along left lateral aspect placed.   Xi robot then docked into place.  Umbilical with omental contents noted.  Several minutes needed to gently reduce all hernia contents, including large portion of the sac.  Preperitoneal plane was entered by making a incision along the right lateral aspect of the peritoneum.  This flap was carried across the abdomen to the other side of the defect, reducing all hernia contents and preperitoneal lipoma within the hernia defect.  Small amounts of bleeding was controlled with cautery.  Once adequate exposure of the defect and adequate space was created to place the mesh,   Incarcerated umbilical hernia defect measured 1.3cm x 1cm. Insufflation dropped to 8mm and  transfacial suture with 0 stratafix used to primarily close defect under minimal tension.    ProGrip mesh cut to size with adequate overlap around the defect edges was placed within the abdominal cavity through 12mm port and secured to the abdominal wall centered over the defect. The peritoneal flap was then closed with a running 3-0 V lock.  Robot was undocked.  The 12mm cannula was removed and port site was closed using PMI device and 0 vicryl suture, ensuring no bowels were injured during this process.  Abdomen then desufflated while camera within abdomen to ensure no signs of new bleed prior to removing camera and rest of ports completely.  All skin incisions closed with runninrg 4-0 Monocryl in a subcuticular fashion.  All wounds then dressed with Dermabond.  Patient was then successfully awakened and transferred to PACU in stable condition.  At the end of the procedure sponge and instrument counts were correct.

## 2023-03-29 NOTE — Transfer of Care (Signed)
Immediate Anesthesia Transfer of Care Note  Patient: Alexandra Rios  Procedure(s) Performed: XI ROBOT ASSISTED UMBILICAL HERNIA REPAIR (Abdomen) INSERTION OF MESH  Patient Location: PACU  Anesthesia Type:General  Level of Consciousness: awake, drowsy, and patient cooperative  Airway & Oxygen Therapy: Patient Spontanous Breathing and Patient connected to face mask oxygen  Post-op Assessment: Report given to RN and Post -op Vital signs reviewed and stable  Post vital signs: Reviewed and stable  Last Vitals:  Vitals Value Taken Time  BP 122/55 03/29/23 1330  Temp 36.7 C 03/29/23 1330  Pulse 76 03/29/23 1332  Resp 10 03/29/23 1332  SpO2 100 % 03/29/23 1332  Vitals shown include unfiled device data.  Last Pain:  Vitals:   03/29/23 1117  TempSrc: Temporal  PainSc: 0-No pain         Complications: No notable events documented.

## 2023-03-29 NOTE — Discharge Instructions (Addendum)
Hernia repair, Care After This sheet gives you information about how to care for yourself after your procedure. Your health care provider may also give you more specific instructions. If you have problems or questions, contact your health care provider. What can I expect after the procedure? After your procedure, it is common to have the following: Pain in your abdomen, especially in the incision areas. You will be given medicine to control the pain. Tiredness. This is a normal part of the recovery process. Your energy level will return to normal over the next several weeks. Changes in your bowel movements, such as constipation or needing to go more often. Talk with your health care provider about how to manage this. Follow these instructions at home: Medicines  tylenol and advil as needed for discomfort.  Please alternate between the two every four hours as needed for pain.    Use narcotics, if prescribed, only when tylenol and motrin is not enough to control pain.  325-650mg  every 8hrs to max of 3000mg /24hrs (including the 325mg  in every norco dose) for the tylenol.    Advil up to 800mg  per dose every 8hrs as needed for pain.   PLEASE RECORD NUMBER OF PILLS TAKEN UNTIL NEXT FOLLOW UP APPT.  THIS WILL HELP DETERMINE HOW READY YOU ARE TO BE RELEASED FROM ANY ACTIVITY RESTRICTIONS Do not drive or use heavy machinery while taking prescription pain medicine. Do not drink alcohol while taking prescription pain medicine.  Incision care    Follow instructions from your health care provider about how to take care of your incision areas. Make sure you: Keep your incisions clean and dry. Wash your hands with soap and water before and after applying medicine to the areas, and before and after changing your bandage (dressing). If soap and water are not available, use hand sanitizer. Change your dressing as told by your health care provider. Leave stitches (sutures), skin glue, or adhesive strips in  place. These skin closures may need to stay in place for 2 weeks or longer. If adhesive strip edges start to loosen and curl up, you may trim the loose edges. Do not remove adhesive strips completely unless your health care provider tells you to do that. Do not wear tight clothing over the incisions. Tight clothing may rub and irritate the incision areas, which may cause the incisions to open. Do not take baths, swim, or use a hot tub until your health care provider approves. OK TO SHOWER IN 24HRS.   Check your incision area every day for signs of infection. Check for: More redness, swelling, or pain. More fluid or blood. Warmth. Pus or a bad smell. Activity Avoid lifting anything that is heavier than 10 lb (4.5 kg) for 2 weeks or until your health care provider says it is okay. No pushing/pulling greater than 30lbs You may resume normal activities as told by your health care provider. Ask your health care provider what activities are safe for you. Take rest breaks during the day as needed. Eating and drinking Follow instructions from your health care provider about what you can eat after surgery. To prevent or treat constipation while you are taking prescription pain medicine, your health care provider may recommend that you: Drink enough fluid to keep your urine clear or pale yellow. Take over-the-counter or prescription medicines. Eat foods that are high in fiber, such as fresh fruits and vegetables, whole grains, and beans. Limit foods that are high in fat and processed sugars, such as fried and  sweet foods. General instructions Ask your health care provider when you will need an appointment to get your sutures or staples removed. Keep all follow-up visits as told by your health care provider. This is important. Contact a health care provider if: You have more redness, swelling, or pain around your incisions. You have more fluid or blood coming from the incisions. Your incisions feel  warm to the touch. You have pus or a bad smell coming from your incisions or your dressing. You have a fever. You have an incision that breaks open (edges not staying together) after sutures or staples have been removed. You develop a rash. You have chest pain or difficulty breathing. You have pain or swelling in your legs. You feel light-headed or you faint. Your abdomen swells (becomes distended). You have nausea or vomiting. You have blood in your stool (feces). This information is not intended to replace advice given to you by your health care provider. Make sure you discuss any questions you have with your health care provider. Document Released: 03/09/2005 Document Revised: 05/09/2018 Document Reviewed: 05/21/2016 Elsevier Interactive Patient Education  2019 [employer]     AMBULATORY SURGERY  DISCHARGE INSTRUCTIONS   The drugs that you were given will stay in your system until tomorrow so for the next 24 hours you should not:  Drive an automobile Make any legal decisions Drink any alcoholic beverage   You may resume regular meals tomorrow.  Today it is better to start with liquids and gradually work up to solid foods.  You may eat anything you prefer, but it is better to start with liquids, then soup and crackers, and gradually work up to solid foods.   Please notify your doctor immediately if you have any unusual bleeding, trouble breathing, redness and pain at the surgery site, drainage, fever, or pain not relieved by medication.    Additional Instructions: PLEASE LEAVE EXPAREL (TEAL/GREEN) ARMBAND ON FOR 4 DAYS

## 2023-03-29 NOTE — Anesthesia Preprocedure Evaluation (Signed)
Anesthesia Evaluation  Patient identified by MRN, date of birth, ID band Patient awake    Reviewed: Allergy & Precautions, NPO status , Patient's Chart, lab work & pertinent test results  Airway Mallampati: III  TM Distance: >3 FB Neck ROM: full    Dental no notable dental hx. (+) Chipped, Dental Advidsory Given   Pulmonary neg pulmonary ROS   Pulmonary exam normal        Cardiovascular hypertension, Pt. on medications negative cardio ROS Normal cardiovascular exam     Neuro/Psych negative neurological ROS  negative psych ROS   GI/Hepatic Neg liver ROS,GERD  Medicated,,  Endo/Other    Morbid obesity  Renal/GU      Musculoskeletal   Abdominal   Peds  Hematology negative hematology ROS (+)   Anesthesia Other Findings Past Medical History: No date: Anemia No date: Arthritis     Comment:  viral No date: Diverticulitis No date: Diverticulosis No date: Essential hypertension     Comment:  never on medication No date: Fibrocystic breast disease No date: Fibroids No date: GERD (gastroesophageal reflux disease) No date: Heart burn No date: Heart murmur No date: Hyperlipidemia No date: IC (interstitial cystitis) No date: Lymphedema No date: Morbid obesity with BMI of 40.0-44.9, adult (HCC) No date: Pure hypercholesterolemia No date: Recurrent UTI 03/2023: Umbilical hernia without obstruction and without gangrene No date: Vitamin D deficiency  Past Surgical History: 11/2015: BREAST BIOPSY; Left 1987: BREAST EXCISIONAL BIOPSY; Left 1987: CHOLECYSTECTOMY 08/17/2013: COLONOSCOPY 06/15/2021: COLONOSCOPY WITH PROPOFOL; N/A     Comment:  Procedure: COLONOSCOPY WITH PROPOFOL;  Surgeon: Jaynie Collins, DO;  Location: [facility] ENDOSCOPY;  Service:               Gastroenterology;  Laterality: N/A; 2005: DILATION AND CURETTAGE OF UTERUS     Reproductive/Obstetrics negative OB ROS                              Anesthesia Physical Anesthesia Plan  ASA: 3  Anesthesia Plan: General ETT   Post-op Pain Management:    Induction: Intravenous  PONV Risk Score and Plan: 4 or greater and Ondansetron, Midazolam and Dexamethasone  Airway Management Planned: Oral ETT  Additional Equipment:   Intra-op Plan:   Post-operative Plan: Extubation in OR  Informed Consent: I have reviewed the patients History and Physical, chart, labs and discussed the procedure including the risks, benefits and alternatives for the proposed anesthesia with the patient or authorized representative who has indicated his/her understanding and acceptance.     Dental Advisory Given  Plan Discussed with: Anesthesiologist, CRNA and Surgeon  Anesthesia Plan Comments: (Patient consented for risks of anesthesia including but not limited to:  - adverse reactions to medications - damage to eyes, teeth, lips or other oral mucosa - nerve damage due to positioning  - sore throat or hoarseness - Damage to heart, brain, nerves, lungs, other parts of body or loss of life  Patient voiced understanding.)       Anesthesia Quick Evaluation

## 2023-03-29 NOTE — Anesthesia Procedure Notes (Signed)
Procedure Name: Intubation Date/Time: 03/29/2023 11:52 AM  Performed by: Mohammed Kindle, CRNAPre-anesthesia Checklist: Patient identified, Emergency Drugs available, Suction available and Patient being monitored Patient Re-evaluated:Patient Re-evaluated prior to induction Oxygen Delivery Method: Circle system utilized Preoxygenation: Pre-oxygenation with 100% oxygen Induction Type: IV induction Ventilation: Mask ventilation without difficulty Laryngoscope Size: McGraph and 3 Grade View: Grade I Tube type: Oral Tube size: 7.0 mm Number of attempts: 1 Airway Equipment and Method: Stylet Placement Confirmation: ETT inserted through vocal cords under direct vision, positive ETCO2, breath sounds checked- equal and bilateral and CO2 detector Secured at: 21 cm Tube secured with: Tape Dental Injury: Teeth and Oropharynx as per pre-operative assessment

## 2023-03-29 NOTE — Interval H&P Note (Signed)
No change. OK to proceed.

## 2023-03-29 NOTE — Anesthesia Postprocedure Evaluation (Signed)
Anesthesia Post Note  Patient: [name]  Procedure(s) Performed: XI ROBOT ASSISTED UMBILICAL HERNIA REPAIR (Abdomen) INSERTION OF MESH  Patient location during evaluation: PACU Anesthesia Type: General Level of consciousness: awake and alert Pain management: pain level controlled Vital Signs Assessment: post-procedure vital signs reviewed and stable Respiratory status: spontaneous breathing, nonlabored ventilation, respiratory function stable and patient connected to nasal cannula oxygen Cardiovascular status: blood pressure returned to baseline and stable Postop Assessment: no apparent nausea or vomiting Anesthetic complications: no  No notable events documented.   Last Vitals:  Vitals:   03/29/23 1117 03/29/23 1330  BP: (!) 166/68 (!) 122/55  Pulse: 69 81  Resp: 16 10  Temp: 37.2 C 36.7 C  SpO2: 100% 100%    Last Pain:  Vitals:   03/29/23 1330  TempSrc:   PainSc: Asleep                 Stephanie Coup

## 2023-04-15 NOTE — Progress Notes (Unsigned)
PCP: Marguarite Arbour, MD   No chief complaint on file.   HPI:      [name] is a 67 y.o. [id number] whose LMP was No LMP recorded. Patient is postmenopausal., presents today for her annual examination.  Her menses are absent due to menopause. No PMB. She {does:18564} have vasomotor sx.   Sex activity: {sex active: 315163}. She {does:18564} have vaginal dryness.  Last Pap: 09/29/20  Results were: no abnormalities /neg HPV DNA.  Hx of STDs: {STD hx:14358}  Last mammogram: 08/23/20 Results were: normal--routine follow-up in 12 months There is no FH of breast cancer. There is no FH of ovarian cancer. The patient {does:18564} do self-breast exams.  Colonoscopy: 10/22 with polyps with [facility];  Repeat due after 10*** years. FH colon cancer in her brother.  DEXA: ***   Tobacco use: {tob:20664} Alcohol use: {Alcohol:11675} No drug use Exercise: {exercise:31265}  She {does:18564} get adequate calcium and Vitamin D in her diet.  Labs with PCP.   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/19/2018   Right leg pain 08/14/2018   Arthritis 05/14/2017   Obesity (BMI 30-39.9) 02/06/2017   Recurrent UTI 12/03/2015   Microscopic hematuria 12/03/2015   Cystocele, grade 2 12/03/2015   Elevated serum creatinine 12/03/2015   Left breast mass 11/28/2015   Absolute anemia 08/30/2015   BP (high blood pressure) 08/30/2015   Headache, migraine 08/30/2015   Adiposity 08/30/2015   Frequent UTI 08/30/2015    Past Surgical History:  Procedure Laterality Date   BREAST BIOPSY Left 11/2015   BREAST EXCISIONAL BIOPSY Left 1987   CHOLECYSTECTOMY  1987   COLONOSCOPY  08/17/2013   COLONOSCOPY WITH PROPOFOL N/A 06/15/2021    Procedure: COLONOSCOPY WITH PROPOFOL;  Surgeon: Jaynie Collins, DO;  Location: [facility] ENDOSCOPY;  Service: Gastroenterology;  Laterality: N/A;   DILATION AND CURETTAGE OF UTERUS  2005   INSERTION OF MESH  03/29/2023   Procedure: INSERTION OF MESH;  Surgeon: Sung Amabile, DO;  Location: [facility] ORS;  Service: General;;    Family History  Problem Relation Age of Onset   Bladder Cancer Maternal Uncle    Kidney disease Maternal Uncle    Colon cancer Brother    Lung cancer Brother    Liver cancer Brother    Breast cancer Neg Hx     Social History   Socioeconomic History   Marital status: Married    Spouse name: Harvie Heck   Number of children: 1   Years of education: Not on file   Highest education level: Not on file  Occupational History   Not on file  Tobacco Use   Smoking status: Never   Smokeless tobacco: Never  Vaping Use   Vaping status: Never Used  Substance and Sexual Activity   Alcohol use: No    Alcohol/week: 0.0 standard drinks of alcohol   Drug use: No   Sexual activity: Yes  Other Topics Concern   Not on file  Social History Narrative   Not on file   Social Determinants of Health   Financial Resource Strain: Not on file  Food Insecurity: Not on file  Transportation Needs: Not on file  Physical Activity: Not on file  Stress: Not on file  Social Connections: Not on file  Intimate Partner Violence: Not on file     Current Outpatient Medications:    acetaminophen (TYLENOL) 325 MG tablet, Take 2 tablets (650 mg total) by mouth every 8 (eight) hours as needed for mild pain., Disp: 40 tablet, Rfl: 0   Ascorbic Acid (VITAMIN C PO), Take 1,000 mg by mouth 2 (two) times daily., Disp: , Rfl:    B Complex Vitamins (VITAMIN-B COMPLEX PO), Take 1 tablet by mouth daily., Disp: , Rfl:    Biotin 5000 MCG CAPS, Take 1 capsule by mouth daily., Disp: , Rfl:    COLLAGEN PO, Take 1 Dose by mouth 2 (two) times daily., Disp: , Rfl:    Glycine POWD, Take 6 g by mouth daily.,  Disp: , Rfl:    HYDROcodone-acetaminophen (NORCO) 5-325 MG tablet, Take 1 tablet by mouth every 6 (six) hours as needed for up to 6 doses for moderate pain., Disp: 6 tablet, Rfl: 0   KRILL OIL PO, Take 1,250 mg by mouth daily., Disp: , Rfl:    Magnesium ([employer] TRIPLE MAGNESIUM COMPLEX) 400 MG CAPS, Take 1 capsule by mouth daily., Disp: , Rfl:    Multiple Vitamins-Minerals (CENTRUM SILVER PO), Take 1 tablet by mouth daily., Disp: , Rfl:    Niacin (VITAMIN B-3 PO), Take 50 mg by mouth 3 (three) times daily., Disp: , Rfl:    OVER THE COUNTER MEDICATION, Take 10 mg by mouth daily. Zinc Orotate, Disp: , Rfl:    OVER THE COUNTER MEDICATION, Take 100 mg by mouth daily. Theracurmin, Disp: , Rfl:    OVER THE COUNTER MEDICATION, Take 10 mg by mouth 2 (two) times daily. Bioperine, Disp: , Rfl:    Probiotic Product (PROBIOTIC PO), Take 2 capsules by mouth daily., Disp: , Rfl:    QUERCETIN PO, Take 1 capsule by mouth 2 (two) times daily. Quercetin 1000 mg Bromelain 300 mg, Disp: , Rfl:    TART CHERRY PO, Take 2,000 mg by mouth daily., Disp: , Rfl:    VITAMIN D-VITAMIN K PO, Take 1 tablet by mouth daily. Vitamin D 10,000 units Vitamin K 180 mg, Disp: , Rfl:    vitamin E 180 MG (400 UNITS) capsule, Take 400 Units by mouth daily., Disp: , Rfl:      ROS:  Review of Systems BREAST: No symptoms    Objective: There were no vitals taken for this visit.   OBGyn Exam  Results: No results found for this or any previous visit (from the past 24 hour(s)).  Assessment/Plan:  No diagnosis found.   No orders of the defined types were placed in this encounter.           GYN counsel {counseling: 16159}    F/U  No follow-ups on file.  Kennieth Plotts B. Evanny Ellerbe, PA-C 04/15/2023 4:51 PM

## 2023-04-16 ENCOUNTER — Encounter: Payer: Self-pay | Admitting: Obstetrics and Gynecology

## 2023-04-16 ENCOUNTER — Ambulatory Visit (INDEPENDENT_AMBULATORY_CARE_PROVIDER_SITE_OTHER): Payer: Managed Care, Other (non HMO) | Admitting: Obstetrics and Gynecology

## 2023-04-16 VITALS — BP 122/90 | Ht 65.0 in | Wt 257.0 lb

## 2023-04-16 DIAGNOSIS — Z1211 Encounter for screening for malignant neoplasm of colon: Secondary | ICD-10-CM

## 2023-04-16 DIAGNOSIS — Z13820 Encounter for screening for osteoporosis: Secondary | ICD-10-CM

## 2023-04-16 DIAGNOSIS — Z01419 Encounter for gynecological examination (general) (routine) without abnormal findings: Secondary | ICD-10-CM

## 2023-04-16 DIAGNOSIS — Z1231 Encounter for screening mammogram for malignant neoplasm of breast: Secondary | ICD-10-CM

## 2023-04-16 NOTE — Patient Instructions (Addendum)
I value your feedback and you entrusting us with your care. If you get a [facility] patient survey, I would appreciate you taking the time to let us know about your experience today. Thank you!  [facility] ([place])--[phone]  

## 2023-05-29 ENCOUNTER — Encounter: Payer: Self-pay | Admitting: Surgery

## 2023-08-09 ENCOUNTER — Ambulatory Visit (INDEPENDENT_AMBULATORY_CARE_PROVIDER_SITE_OTHER): Payer: Managed Care, Other (non HMO) | Admitting: Nurse Practitioner

## 2023-08-13 ENCOUNTER — Ambulatory Visit (INDEPENDENT_AMBULATORY_CARE_PROVIDER_SITE_OTHER): Payer: Managed Care, Other (non HMO) | Admitting: Nurse Practitioner

## 2023-08-13 ENCOUNTER — Encounter (INDEPENDENT_AMBULATORY_CARE_PROVIDER_SITE_OTHER): Payer: Self-pay | Admitting: Nurse Practitioner

## 2023-08-13 VITALS — BP 130/76 | HR 66 | Resp 16 | Wt 255.2 lb

## 2023-08-13 DIAGNOSIS — I890 Lymphedema, not elsewhere classified: Secondary | ICD-10-CM | POA: Diagnosis not present

## 2023-08-13 DIAGNOSIS — I10 Essential (primary) hypertension: Secondary | ICD-10-CM | POA: Diagnosis not present

## 2023-08-18 ENCOUNTER — Encounter (INDEPENDENT_AMBULATORY_CARE_PROVIDER_SITE_OTHER): Payer: Self-pay | Admitting: Nurse Practitioner

## 2023-08-18 NOTE — Progress Notes (Signed)
Subjective:    Patient ID: LASHICA ZACHAR, female    DOB: Nov 13, 1955, 67 y.o.   MRN: [id number] Chief Complaint  Patient presents with   Follow-up    6 month follow up    The patient is a 67 year old female who returns today for evaluation for lower extremity edema.  She has been very diligent with use of medical grade compression elevation and activity.  In addition she has recently started an oral supplement of niacinamide 50 mg 3 times per day.  This has shown significant improvements in her weight and her swelling.  She notes that her energy levels have improved from this and just she feels that she is doing much better overall.  There has been no development of any open wounds or ulcerations.  Her lipodermatosclerosis improved as well as her stasis dermatitis.    Review of Systems  All other systems reviewed and are negative.      Objective:   Physical Exam Vitals reviewed.  HENT:     Head: Normocephalic.  Cardiovascular:     Rate and Rhythm: Normal rate.     Pulses: Normal pulses.  Pulmonary:     Effort: Pulmonary effort is normal.  Skin:    General: Skin is warm and dry.  Neurological:     Mental Status: She is alert and oriented to person, place, and time.  Psychiatric:        Mood and Affect: Mood normal.        Behavior: Behavior normal.        Thought Content: Thought content normal.        Judgment: Judgment normal.     BP 130/76 (BP Location: Left Arm)   Pulse 66   Resp 16   Wt 255 lb 3.2 oz (115.8 kg)   BMI 42.47 kg/m   Past Medical History:  Diagnosis Date   Anemia    Arthritis    viral   Diverticulitis    Diverticulosis    Essential hypertension    never on medication   Fibrocystic breast disease    Fibroids    GERD (gastroesophageal reflux disease)    Heart burn    Heart murmur    Hyperlipidemia    IC (interstitial cystitis)    Lymphedema    Morbid obesity with BMI of 40.0-44.9, adult (HCC)    Pure hypercholesterolemia     Recurrent UTI    Umbilical hernia without obstruction and without gangrene 03/2023   Vitamin D deficiency     Social History   Socioeconomic History   Marital status: Married    Spouse name: Harvie Heck   Number of children: 1   Years of education: Not on file   Highest education level: Not on file  Occupational History   Not on file  Tobacco Use   Smoking status: Never   Smokeless tobacco: Never  Vaping Use   Vaping status: Never Used  Substance and Sexual Activity   Alcohol use: No    Alcohol/week: 0.0 standard drinks of alcohol   Drug use: No   Sexual activity: Yes    Birth control/protection: Post-menopausal  Other Topics Concern   Not on file  Social History Narrative   Not on file   Social Drivers of Health   Financial Resource Strain: Not on file  Food Insecurity: Not on file  Transportation Needs: Not on file  Physical Activity: Not on file  Stress: Not on file  Social Connections: Not on file  Intimate Partner Violence: Not on file    Past Surgical History:  Procedure Laterality Date   BREAST BIOPSY Left 11/2015   BREAST EXCISIONAL BIOPSY Left 1987   CHOLECYSTECTOMY  1987   COLONOSCOPY  08/17/2013   COLONOSCOPY WITH PROPOFOL N/A 06/15/2021   Procedure: COLONOSCOPY WITH PROPOFOL;  Surgeon: Jaynie Collins, DO;  Location: [facility] ENDOSCOPY;  Service: Gastroenterology;  Laterality: N/A;   DILATION AND CURETTAGE OF UTERUS  2005   INSERTION OF MESH  03/29/2023   Procedure: INSERTION OF MESH;  Surgeon: Sung Amabile, DO;  Location: [facility] ORS;  Service: General;;    Family History  Problem Relation Age of Onset   Colon cancer Brother 58   Lung cancer Brother    Liver cancer Brother    Breast cancer Neg Hx     Allergies  Allergen Reactions   Naproxen Rash    Breaks out with red bumps   Amoxicillin-Pot Clavulanate Nausea And Vomiting   Avocado Nausea Only   Cephalosporins Itching   Septra [Sulfamethoxazole-Trimethoprim] Itching       Latest Ref Rng  & Units 03/20/2023    2:02 PM  CBC  WBC 4.0 - 10.5 K/uL 8.0   Hemoglobin 12.0 - 15.0 g/dL 69.6   Hematocrit 29.5 - 46.0 % 39.1   Platelets 150 - 400 K/uL 274       CMP     Component Value Date/Time   NA 139 03/20/2023 1402   K 4.5 03/20/2023 1402   CL 106 03/20/2023 1402   CO2 25 03/20/2023 1402   GLUCOSE 97 03/20/2023 1402   BUN 21 03/20/2023 1402   BUN 18 11/29/2015 1502   CREATININE 0.69 03/20/2023 1402   CALCIUM 9.3 03/20/2023 1402   GFRNONAA >60 03/20/2023 1402     No results found.     Assessment & Plan:   1. Lymphedema (Primary) The patient's leg swelling is doing much better as is her redness as is the lipodermatosclerosis after starting oral niacinamide 50 mg.  She is advised to continue with this course in addition to continue with conservative therapy of compression socks, elevation and activity.  She will also continue with exercise and endeavor to lose weight as well.  Because her swelling is much improved we will have her return in 1 year or sooner if issues arise.   2. Essential hypertension Continue antihypertensive medications as already ordered, these medications have been reviewed and there are no changes at this time.   Current Outpatient Medications on File Prior to Visit  Medication Sig Dispense Refill   Ascorbic Acid (VITAMIN C PO) Take 1,000 mg by mouth 2 (two) times daily.     B Complex Vitamins (VITAMIN-B COMPLEX PO) Take 1 tablet by mouth daily.     Biotin 5000 MCG CAPS Take 1 capsule by mouth daily.     COLLAGEN PO Take 1 Dose by mouth 2 (two) times daily.     Glycine POWD Take 6 g by mouth daily.     KRILL OIL PO Take 1,250 mg by mouth daily.     Magnesium ([employer] TRIPLE MAGNESIUM COMPLEX) 400 MG CAPS Take 1 capsule by mouth daily.     Multiple Vitamins-Minerals (CENTRUM SILVER PO) Take 1 tablet by mouth daily.     Niacin (VITAMIN B-3 PO) Take 50 mg by mouth 3 (three) times daily.     OVER THE COUNTER MEDICATION Take 10 mg by mouth daily.  Zinc Orotate     OVER THE COUNTER MEDICATION  Take 100 mg by mouth daily. Theracurmin     OVER THE COUNTER MEDICATION Take 10 mg by mouth 2 (two) times daily. Bioperine     Probiotic Product (PROBIOTIC PO) Take 2 capsules by mouth daily.     QUERCETIN PO Take 1 capsule by mouth 2 (two) times daily. Quercetin 1000 mg Bromelain 300 mg     TART CHERRY PO Take 2,000 mg by mouth daily.     VITAMIN D-VITAMIN K PO Take 1 tablet by mouth daily. Vitamin D 10,000 units Vitamin K 180 mg     vitamin E 180 MG (400 UNITS) capsule Take 400 Units by mouth daily.     No current facility-administered medications on file prior to visit.    There are no Patient Instructions on file for this visit. No follow-ups on file.   Georgiana Spinner, NP

## 2023-09-11 ENCOUNTER — Ambulatory Visit
Admission: RE | Admit: 2023-09-11 | Discharge: 2023-09-11 | Disposition: A | Discharge status: Home / Self Care | Payer: Managed Care, Other (non HMO) | Source: Ambulatory Visit | Attending: Obstetrics and Gynecology | Admitting: Obstetrics and Gynecology

## 2023-09-11 DIAGNOSIS — Z1231 Encounter for screening mammogram for malignant neoplasm of breast: Secondary | ICD-10-CM | POA: Insufficient documentation

## 2024-01-21 ENCOUNTER — Encounter (INDEPENDENT_AMBULATORY_CARE_PROVIDER_SITE_OTHER): Payer: Self-pay

## 2024-02-18 ENCOUNTER — Other Ambulatory Visit: Payer: Self-pay | Admitting: Internal Medicine

## 2024-02-18 DIAGNOSIS — R1032 Left lower quadrant pain: Secondary | ICD-10-CM

## 2024-02-25 ENCOUNTER — Ambulatory Visit
Admission: RE | Admit: 2024-02-25 | Discharge: 2024-02-25 | Disposition: A | Discharge status: Home / Self Care | Source: Ambulatory Visit | Attending: Internal Medicine | Admitting: Internal Medicine

## 2024-02-25 DIAGNOSIS — R1032 Left lower quadrant pain: Secondary | ICD-10-CM | POA: Diagnosis present

## 2024-02-25 MED ORDER — IOHEXOL 300 MG/ML  SOLN
100.0000 mL | Freq: Once | INTRAMUSCULAR | Status: AC | PRN
  Administered 2024-02-25: 100 mL via INTRAVENOUS

## 2024-08-12 ENCOUNTER — Ambulatory Visit (INDEPENDENT_AMBULATORY_CARE_PROVIDER_SITE_OTHER): Payer: Managed Care, Other (non HMO) | Admitting: Nurse Practitioner
